# Patient Record
Sex: Male | Born: 1990 | Race: White | Hispanic: No | Marital: Married | Smoking: Current every day smoker
Health system: Southern US, Community
[De-identification: ages and names within clinical notes are randomized; demographics above are authoritative.]

## PROBLEM LIST (undated history)

## (undated) DIAGNOSIS — K319 Disease of stomach and duodenum, unspecified: Secondary | ICD-10-CM

## (undated) DIAGNOSIS — M419 Scoliosis, unspecified: Secondary | ICD-10-CM

## (undated) DIAGNOSIS — T7840XA Allergy, unspecified, initial encounter: Secondary | ICD-10-CM

## (undated) DIAGNOSIS — F419 Anxiety disorder, unspecified: Secondary | ICD-10-CM

## (undated) DIAGNOSIS — F32A Depression, unspecified: Secondary | ICD-10-CM

## (undated) DIAGNOSIS — F329 Major depressive disorder, single episode, unspecified: Secondary | ICD-10-CM

## (undated) DIAGNOSIS — K8020 Calculus of gallbladder without cholecystitis without obstruction: Secondary | ICD-10-CM

## (undated) DIAGNOSIS — K589 Irritable bowel syndrome without diarrhea: Secondary | ICD-10-CM

## (undated) HISTORY — PX: OTHER SURGICAL HISTORY: SHX169

## (undated) HISTORY — DX: Disease of stomach and duodenum, unspecified: K31.9

## (undated) HISTORY — DX: Calculus of gallbladder without cholecystitis without obstruction: K80.20

## (undated) HISTORY — DX: Allergy, unspecified, initial encounter: T78.40XA

---

## 2012-03-08 ENCOUNTER — Emergency Department (HOSPITAL_BASED_OUTPATIENT_CLINIC_OR_DEPARTMENT_OTHER)
Admission: EM | Admit: 2012-03-08 | Discharge: 2012-03-08 | Disposition: A | Discharge status: Home / Self Care | Payer: Self-pay | Attending: Emergency Medicine | Admitting: Emergency Medicine

## 2012-03-08 ENCOUNTER — Encounter (HOSPITAL_BASED_OUTPATIENT_CLINIC_OR_DEPARTMENT_OTHER): Payer: Self-pay

## 2012-03-08 DIAGNOSIS — R197 Diarrhea, unspecified: Secondary | ICD-10-CM | POA: Insufficient documentation

## 2012-03-08 DIAGNOSIS — K529 Noninfective gastroenteritis and colitis, unspecified: Secondary | ICD-10-CM

## 2012-03-08 DIAGNOSIS — R112 Nausea with vomiting, unspecified: Secondary | ICD-10-CM | POA: Insufficient documentation

## 2012-03-08 DIAGNOSIS — F17200 Nicotine dependence, unspecified, uncomplicated: Secondary | ICD-10-CM | POA: Insufficient documentation

## 2012-03-08 HISTORY — DX: Scoliosis, unspecified: M41.9

## 2012-03-08 MED ORDER — DIPHENOXYLATE-ATROPINE 2.5-0.025 MG PO TABS: 1.0000 | ORAL_TABLET | Freq: Four times a day (QID) | ORAL | Status: AC | PRN

## 2012-03-08 MED ORDER — ONDANSETRON 8 MG PO TBDP
8.0000 mg | ORAL_TABLET | Freq: Once | ORAL | Status: AC
  Administered 2012-03-08: 8 mg via ORAL
  Filled 2012-03-08: qty 1

## 2012-03-08 MED ORDER — ONDANSETRON 8 MG PO TBDP: 8.0000 mg | ORAL_TABLET | Freq: Three times a day (TID) | ORAL | Status: AC | PRN

## 2012-03-08 MED ORDER — DIPHENOXYLATE-ATROPINE 2.5-0.025 MG PO TABS
2.0000 | ORAL_TABLET | Freq: Once | ORAL | Status: AC
  Administered 2012-03-08: 2 via ORAL
  Filled 2012-03-08: qty 2

## 2012-03-08 NOTE — ED Notes (Signed)
Onset of vomiting about 2 day ago, diarrhea x 1 day.  Vomited 1x in 24 hours, 4-5 episodes of diarrhea in 24 hours.  Abdominal soreness/ cramping from vomiting per pt.

## 2012-03-08 NOTE — ED Provider Notes (Signed)
History     CSN: [id number]  Arrival date & time 03/08/12  2148   First MD Initiated Contact with Patient 03/08/12 2255      Chief Complaint  Patient presents with  . Nausea, vomiting and diarrhea     (Consider location/radiation/quality/duration/timing/severity/associated sxs/prior treatment) HPI Is a 21 year old white male with a two-day history of vomiting and diarrhea. The vomiting started abruptly yesterday without significant preceding nausea. He has had multiple episodes of diarrhea both yesterday and today. He has also had several episodes of vomiting today. There's been some abdominal cramping associated with vomiting but not with the diarrhea. He has noticed blood on toilet paper after wiping. He is not sure she's passed blood in his stool. He denies dry mouth or lightheadedness. He states he is fearful of needles and declined IV fluids offered by his nurse per protocol.  Past Medical History  Diagnosis Date  . Scoliosis     Past Surgical History  Procedure Date  . Multiple leg surgeries     d/t staph infection    History reviewed. No pertinent family history.  History  Substance Use Topics  . Smoking status: Current Everyday Smoker -- 0.5 packs/day for 1 years    Types: Cigarettes  . Smokeless tobacco: Never Used  . Alcohol Use: No      Review of Systems  All other systems reviewed and are negative.    Allergies  Cashew nut oil  Home Medications   Current Outpatient Rx  Name Route Sig Dispense Refill  . IBUPROFEN 200 MG PO TABS Oral Take 400 mg by mouth every 6 (six) hours as needed. For pain      BP 118/71  Pulse 81  Temp(Src) 98.2 F (36.8 C) (Oral)  Resp 15  Ht 5\' 7"  (1.702 m)  Wt 135 lb (61.236 kg)  BMI 21.14 kg/m2  SpO2 98%  Physical Exam General: Well-developed, well-nourished male in no acute distress; appearance consistent with age of record HENT: normocephalic, atraumatic; mucous membranes moist Eyes: pupils equal round and  reactive to light; extraocular muscles intact Neck: supple Heart: regular rate and rhythm; no murmurs, rubs or gallops Lungs: clear to auscultation bilaterally Abdomen: soft; nondistended; nontender; no masses or hepatosplenomegaly; bowel sounds present Rectal: Normal sphincter tone; brown stool in rectal vault; no blood on examining glove Extremities: No deformity; full range of motion Neurologic: Awake, alert and oriented; motor function intact in all extremities and symmetric; no facial droop Skin: Warm and dry Psychiatric: Anxious    ED Course  Procedures (including critical care time)     MDM  We'll treat with by mouth medications. Patient was advised to return for inability to keep hydrated or for increased bleeding per rectum.        Hanley Seamen, MD 03/08/12 [phone]

## 2012-06-24 ENCOUNTER — Encounter (HOSPITAL_COMMUNITY): Payer: Self-pay | Admitting: *Deleted

## 2012-06-24 ENCOUNTER — Emergency Department (HOSPITAL_COMMUNITY)
Admission: EM | Admit: 2012-06-24 | Discharge: 2012-06-25 | Disposition: A | Discharge status: Other Acute Inpatient Hospital | Payer: Self-pay | Attending: *Deleted | Admitting: *Deleted

## 2012-06-24 DIAGNOSIS — T50901A Poisoning by unspecified drugs, medicaments and biological substances, accidental (unintentional), initial encounter: Secondary | ICD-10-CM | POA: Insufficient documentation

## 2012-06-24 DIAGNOSIS — X838XXA Intentional self-harm by other specified means, initial encounter: Secondary | ICD-10-CM | POA: Insufficient documentation

## 2012-06-24 DIAGNOSIS — T1491XA Suicide attempt, initial encounter: Secondary | ICD-10-CM

## 2012-06-24 DIAGNOSIS — F329 Major depressive disorder, single episode, unspecified: Secondary | ICD-10-CM

## 2012-06-24 DIAGNOSIS — F3289 Other specified depressive episodes: Secondary | ICD-10-CM | POA: Insufficient documentation

## 2012-06-24 HISTORY — DX: Irritable bowel syndrome, unspecified: K58.9

## 2012-06-24 LAB — COMPREHENSIVE METABOLIC PANEL
ALT: 11 U/L (ref 0–53)
Alkaline Phosphatase: 62 U/L (ref 39–117)
BUN: 14 mg/dL (ref 6–23)
CO2: 25 mEq/L (ref 19–32)
Chloride: 102 mEq/L (ref 96–112)
GFR calc Af Amer: >90 mL/min (ref >90)
Glucose, Bld: 85 mg/dL (ref 70–99)
Potassium: 3.3 mEq/L — ABNORMAL LOW (ref 3.5–5.1)
Total Bilirubin: 0.3 mg/dL (ref 0.3–1.2)

## 2012-06-24 LAB — CBC WITH DIFFERENTIAL/PLATELET
Hemoglobin: 15.9 g/dL (ref 13.0–17.0)
Lymphocytes Relative: 30 % (ref 12–46)
Lymphs Abs: 2.1 10*3/uL (ref 0.7–4.0)
MCH: 32.9 pg (ref 26.0–34.0)
Monocytes Relative: 7 % (ref 3–12)
Neutro Abs: 4.2 10*3/uL (ref 1.7–7.7)
Neutrophils Relative %: 60 % (ref 43–77)
RBC: 4.84 MIL/uL (ref 4.22–5.81)
WBC: 7 10*3/uL (ref 4.0–10.5)

## 2012-06-24 NOTE — ED Notes (Signed)
Poison control recommends charcoal without sorbitol, watch for anticholinergic effects, IV fluids, cardiac monitoring, ASA level, four hour post ingestion tylenol, monitor x6 hours, if patient becomes agitated treat with benzodiazepines

## 2012-06-24 NOTE — ED Notes (Signed)
ZOX:WRUE<AV> Expected date:06/24/12<BR> Expected time:10:20 PM<BR> Means of arrival:Ambulance<BR> Comments:<BR> Overdose 20 otc sleeping pills

## 2012-06-24 NOTE — ED Notes (Signed)
Pt in via EMS, pt took approx 20 OTC sleeping pills, unsure of exact medication, pt ingested pills about 20-30 min ago, pt alert and oriented, drowsy, airway intact, pt admits to SI

## 2012-06-25 ENCOUNTER — Inpatient Hospital Stay (HOSPITAL_COMMUNITY)
Admission: AD | Admit: 2012-06-25 | Discharge: 2012-07-01 | DRG: 885 | Disposition: A | Discharge status: Home / Self Care | Payer: Federal, State, Local not specified - Other | Source: Ambulatory Visit | Attending: Psychiatry | Admitting: Psychiatry

## 2012-06-25 ENCOUNTER — Encounter (HOSPITAL_COMMUNITY): Payer: Self-pay | Admitting: Rehabilitation

## 2012-06-25 DIAGNOSIS — K6289 Other specified diseases of anus and rectum: Secondary | ICD-10-CM | POA: Diagnosis not present

## 2012-06-25 DIAGNOSIS — Z79899 Other long term (current) drug therapy: Secondary | ICD-10-CM

## 2012-06-25 DIAGNOSIS — M4120 Other idiopathic scoliosis, site unspecified: Secondary | ICD-10-CM | POA: Diagnosis present

## 2012-06-25 DIAGNOSIS — F410 Panic disorder [episodic paroxysmal anxiety] without agoraphobia: Secondary | ICD-10-CM

## 2012-06-25 DIAGNOSIS — F17200 Nicotine dependence, unspecified, uncomplicated: Secondary | ICD-10-CM | POA: Diagnosis present

## 2012-06-25 DIAGNOSIS — F332 Major depressive disorder, recurrent severe without psychotic features: Principal | ICD-10-CM | POA: Diagnosis present

## 2012-06-25 DIAGNOSIS — K589 Irritable bowel syndrome without diarrhea: Secondary | ICD-10-CM | POA: Diagnosis present

## 2012-06-25 DIAGNOSIS — F1210 Cannabis abuse, uncomplicated: Secondary | ICD-10-CM | POA: Diagnosis present

## 2012-06-25 DIAGNOSIS — IMO0002 Reserved for concepts with insufficient information to code with codable children: Secondary | ICD-10-CM

## 2012-06-25 DIAGNOSIS — F339 Major depressive disorder, recurrent, unspecified: Secondary | ICD-10-CM | POA: Diagnosis present

## 2012-06-25 DIAGNOSIS — F411 Generalized anxiety disorder: Secondary | ICD-10-CM | POA: Diagnosis present

## 2012-06-25 HISTORY — DX: Depression, unspecified: F32.A

## 2012-06-25 HISTORY — DX: Anxiety disorder, unspecified: F41.9

## 2012-06-25 HISTORY — DX: Major depressive disorder, single episode, unspecified: F32.9

## 2012-06-25 LAB — RAPID URINE DRUG SCREEN, HOSP PERFORMED
Amphetamines: NOT DETECTED
Barbiturates: NOT DETECTED
Cocaine: NOT DETECTED
Tetrahydrocannabinol: POSITIVE — AB

## 2012-06-25 LAB — SALICYLATE LEVEL: Salicylate Lvl: <2 mg/dL — ABNORMAL LOW (ref 2.8–20.0)

## 2012-06-25 MED ORDER — IBUPROFEN 600 MG PO TABS: 600.0000 mg | ORAL_TABLET | Freq: Three times a day (TID) | ORAL | Status: DC | PRN

## 2012-06-25 MED ORDER — POTASSIUM CHLORIDE CRYS ER 10 MEQ PO TBCR
10.0000 meq | EXTENDED_RELEASE_TABLET | ORAL | Status: AC
  Administered 2012-06-25 – 2012-06-28 (×6): 10 meq via ORAL
  Filled 2012-06-25 (×7): qty 1

## 2012-06-25 MED ORDER — CITALOPRAM HYDROBROMIDE 20 MG PO TABS
20.0000 mg | ORAL_TABLET | Freq: Every day | ORAL | Status: DC
  Administered 2012-06-25 – 2012-06-29 (×5): 20 mg via ORAL
  Filled 2012-06-25 (×7): qty 1

## 2012-06-25 MED ORDER — MAGNESIUM HYDROXIDE 400 MG/5ML PO SUSP
30.0000 mL | Freq: Every day | ORAL | Status: DC | PRN
  Administered 2012-06-29: 30 mL via ORAL

## 2012-06-25 MED ORDER — ONDANSETRON HCL 4 MG/2ML IJ SOLN
INTRAMUSCULAR | Status: AC
  Filled 2012-06-25: qty 2

## 2012-06-25 MED ORDER — ACETAMINOPHEN 325 MG PO TABS
650.0000 mg | ORAL_TABLET | Freq: Four times a day (QID) | ORAL | Status: DC | PRN
  Administered 2012-06-26: 650 mg via ORAL

## 2012-06-25 MED ORDER — ALUM & MAG HYDROXIDE-SIMETH 200-200-20 MG/5ML PO SUSP
30.0000 mL | ORAL | Status: DC | PRN
  Administered 2012-06-27: 30 mL via ORAL

## 2012-06-25 MED ORDER — ONDANSETRON HCL 4 MG PO TABS: 4.0000 mg | ORAL_TABLET | Freq: Three times a day (TID) | ORAL | Status: DC | PRN

## 2012-06-25 MED ORDER — NICOTINE 21 MG/24HR TD PT24
21.0000 mg | MEDICATED_PATCH | Freq: Every day | TRANSDERMAL | Status: DC
  Filled 2012-06-25: qty 1

## 2012-06-25 MED ORDER — TRAZODONE HCL 100 MG PO TABS
100.0000 mg | ORAL_TABLET | Freq: Every evening | ORAL | Status: DC | PRN
  Administered 2012-06-25 – 2012-06-30 (×5): 100 mg via ORAL
  Filled 2012-06-25: qty 1
  Filled 2012-06-25: qty 14
  Filled 2012-06-25 (×4): qty 1

## 2012-06-25 MED ORDER — ALUM & MAG HYDROXIDE-SIMETH 200-200-20 MG/5ML PO SUSP: 30.0000 mL | ORAL | Status: DC | PRN

## 2012-06-25 MED ORDER — POTASSIUM CHLORIDE CRYS ER 10 MEQ PO TBCR: 10.0000 meq | EXTENDED_RELEASE_TABLET | ORAL | Status: DC

## 2012-06-25 NOTE — ED Provider Notes (Addendum)
BP 106/66  Pulse 64  Temp 97.9 F (36.6 C) (Oral)  Resp 16  SpO2 98%  Patient seen and evaluated by me. No complaints at this time.   Forbes Cellar, MD 06/25/12 [phone]  Patient accepted to [facility] Dr. Elsie Saas per ACT team. Stable for transfer.   Forbes Cellar, MD 06/25/12 1012

## 2012-06-25 NOTE — Progress Notes (Signed)
[facility] Group Notes: (Counselor/Nursing/MHT/Case Management/Adjunct) 06/25/2012   @1 :15pm Breathing & Meditation for Anxiety/Anger   Type of Therapy:  Group Therapy  Participation Level:  Limited  Participation Quality: Attentive    Affect:  Appropriate  Cognitive:  Appropriate  Insight:  Limited  Engagement in Group: Good  Engagement in Therapy:  Limited  Modes of Intervention:  Support and Exploration  Summary of Progress/Problems: Manuel Dean participated in color breathing and safe place guided imagery excercises. He did not process his experience afterward, but did state that he felt more calm.  [name], LCSW 06/25/2012  2:43 PM

## 2012-06-25 NOTE — Progress Notes (Signed)
Psychoeducational Group Note  Date:  06/25/2012 Time:  2155  Group Topic/Focus:  Wrap-Up Group:   The focus of this group is to help patients review their daily goal of treatment and discuss progress on daily workbooks.  Participation Level:  Did Not Attend  Participation Quality:  Didnt attend group  Affect:  Flat  Cognitive:  Appropriate  Insight:  None  Engagement in Group:  None  Additional Comments:  Pt sleeping in bed during group.    Aundria Rud, Alphonza Tramell L 06/25/2012, 9:55 PM
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
## 2012-06-25 NOTE — ED Provider Notes (Signed)
History     CSN: [id number]  Arrival date & time 06/24/12  2239   First MD Initiated Contact with Patient 06/24/12 2329      Chief Complaint  Patient presents with  . Drug Overdose    (Consider location/radiation/quality/duration/timing/severity/associated sxs/prior treatment) HPI This is a 21 year old white male who took an unknown number of an unspecified over-the-counter sleeping pill as well as an unknown number of an unspecified prescription sleeping medication about 30 minutes prior to arrival. He did this in an attempt to commit suicide. He states he is homeless and does not believe life is worth living anymore. He speaks vaguely of other stressors in his life that are "only getting worse". He complains of severe aching in his legs which she attributes to the pills he took. He states he is sleepy but the pain in his legs is preventing him from sleeping. He he denies nausea or vomiting. He denies chest pain, dyspnea or abdominal pain.  Past Medical History  Diagnosis Date  . Scoliosis   . IBS (irritable bowel syndrome)     Past Surgical History  Procedure Date  . Multiple leg surgeries     d/t staph infection    History reviewed. No pertinent family history.  History  Substance Use Topics  . Smoking status: Current Everyday Smoker -- 0.5 packs/day for 1 years    Types: Cigarettes  . Smokeless tobacco: Never Used  . Alcohol Use: No      Review of Systems  All other systems reviewed and are negative.    Allergies  Cashew nut oil and Sulfa antibiotics  Home Medications   Current Outpatient Rx  Name Route Sig Dispense Refill  . IBUPROFEN 200 MG PO TABS Oral Take 400 mg by mouth every 6 (six) hours as needed. For pain    . OVER THE COUNTER MEDICATION Oral Take 1 tablet by mouth at bedtime as needed. Sleeping medication      BP 102/52  Pulse 72  Temp 98.3 F (36.8 C) (Oral)  Resp 16  SpO2 100%  Physical Exam General: Well-developed, well-nourished  male in no acute distress; appearance consistent with age of record HENT: normocephalic, atraumatic Eyes: pupils equal round and reactive to light; extraocular muscles intact Neck: supple Heart: regular rate and rhythm Lungs: clear to auscultation bilaterally Abdomen: soft; nondistended; nontender; bowel sounds present Extremities: No deformity; full range of motion; pulses normal Neurologic: Awake but drowsy; motor function intact in all extremities and symmetric; dysarthria Skin: Warm and dry Psychiatric: Suicidal ideation; depression    ED Course  Procedures (including critical care time)     MDM   Nursing notes and vitals signs, including pulse oximetry, reviewed.  Summary of this visit's results, reviewed by myself:  Labs:  Results for orders placed during the hospital encounter of 06/24/12  CBC WITH DIFFERENTIAL      Component Value Range   WBC 7.0  4.0 - 10.5 K/uL   RBC 4.84  4.22 - 5.81 MIL/uL   Hemoglobin 15.9  13.0 - 17.0 g/dL   HCT 52.8  41.3 - 24.4 %   MCV 90.1  78.0 - 100.0 fL   MCH 32.9  26.0 - 34.0 pg   MCHC 36.5 (*) 30.0 - 36.0 g/dL   RDW 01.0  27.2 - 53.6 %   Platelets 205  150 - 400 K/uL   Neutrophils Relative 60  43 - 77 %   Neutro Abs 4.2  1.7 - 7.7 K/uL   Lymphocytes  Relative 30  12 - 46 %   Lymphs Abs 2.1  0.7 - 4.0 K/uL   Monocytes Relative 7  3 - 12 %   Monocytes Absolute 0.5  0.1 - 1.0 K/uL   Eosinophils Relative 3  0 - 5 %   Eosinophils Absolute 0.2  0.0 - 0.7 K/uL   Basophils Relative 1  0 - 1 %   Basophils Absolute 0.1  0.0 - 0.1 K/uL  COMPREHENSIVE METABOLIC PANEL      Component Value Range   Sodium 138  135 - 145 mEq/L   Potassium 3.3 (*) 3.5 - 5.1 mEq/L   Chloride 102  96 - 112 mEq/L   CO2 25  19 - 32 mEq/L   Glucose, Bld 85  70 - 99 mg/dL   BUN 14  6 - 23 mg/dL   Creatinine, Ser 1.61  0.50 - 1.35 mg/dL   Calcium 9.1  8.4 - 09.6 mg/dL   Total Protein 6.6  6.0 - 8.3 g/dL   Albumin 4.3  3.5 - 5.2 g/dL   AST 22  0 - 37 U/L    ALT 11  0 - 53 U/L   Alkaline Phosphatase 62  39 - 117 U/L   Total Bilirubin 0.3  0.3 - 1.2 mg/dL   GFR calc non Af Amer 88 (*) >90 mL/min   GFR calc Af Amer >90  >90 mL/min  SALICYLATE LEVEL      Component Value Range   Salicylate Lvl <2.0 (*) 2.8 - 20.0 mg/dL  ETHANOL      Component Value Range   Alcohol, Ethyl (B) <11  0 - 11 mg/dL  ACETAMINOPHEN LEVEL      Component Value Range   Acetaminophen (Tylenol), Serum <15.0  10 - 30 ug/mL  URINE RAPID DRUG SCREEN (HOSP PERFORMED)      Component Value Range   Opiates NONE DETECTED  NONE DETECTED   Cocaine NONE DETECTED  NONE DETECTED   Benzodiazepines NONE DETECTED  NONE DETECTED   Amphetamines NONE DETECTED  NONE DETECTED   Tetrahydrocannabinol POSITIVE (*) NONE DETECTED   Barbiturates NONE DETECTED  NONE DETECTED      EKG Interpretation:  Date & Time: 06/24/2012 10:50 PM  Rate: 82  Rhythm: normal sinus rhythm  QRS Axis: right  Intervals: normal  ST/T Wave abnormalities: normal  Conduction Disutrbances:none  Narrative Interpretation: LVH  Old EKG Reviewed: none available  5:32 AM Patient has been evaluated by ACT who will attempt to place patient in an inpatient psychiatric facility.        Hanley Seamen, MD 06/25/12 [phone]

## 2012-06-25 NOTE — ED Notes (Signed)
ACT at bedside 

## 2012-06-26 DIAGNOSIS — F1210 Cannabis abuse, uncomplicated: Secondary | ICD-10-CM | POA: Diagnosis present

## 2012-06-26 DIAGNOSIS — F410 Panic disorder [episodic paroxysmal anxiety] without agoraphobia: Secondary | ICD-10-CM | POA: Diagnosis present

## 2012-06-26 DIAGNOSIS — F339 Major depressive disorder, recurrent, unspecified: Secondary | ICD-10-CM

## 2012-06-26 DIAGNOSIS — F411 Generalized anxiety disorder: Secondary | ICD-10-CM

## 2012-06-26 MED ORDER — ADULT MULTIVITAMIN LIQUID CH
5.0000 mL | Freq: Every day | ORAL | Status: DC
  Administered 2012-06-26: 5 mL via ORAL
  Filled 2012-06-26 (×4): qty 5

## 2012-06-26 MED ORDER — GABAPENTIN 100 MG PO CAPS
100.0000 mg | ORAL_CAPSULE | Freq: Three times a day (TID) | ORAL | Status: DC
  Administered 2012-06-26 (×2): 100 mg via ORAL
  Filled 2012-06-26 (×3): qty 1

## 2012-06-26 MED ORDER — NICOTINE POLACRILEX 2 MG MT GUM: 2.0000 mg | CHEWING_GUM | OROMUCOSAL | Status: DC | PRN

## 2012-06-26 MED ORDER — NICOTINE 21 MG/24HR TD PT24
21.0000 mg | MEDICATED_PATCH | Freq: Every day | TRANSDERMAL | Status: DC
  Administered 2012-06-27 – 2012-07-01 (×5): 21 mg via TRANSDERMAL
  Filled 2012-06-26 (×8): qty 1

## 2012-06-26 MED ORDER — CLONAZEPAM 0.5 MG PO TABS
0.5000 mg | ORAL_TABLET | ORAL | Status: DC
  Administered 2012-06-26 – 2012-06-29 (×6): 0.5 mg via ORAL
  Filled 2012-06-26 (×6): qty 1
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
## 2012-06-26 NOTE — Progress Notes (Signed)
Psychoeducational Group Note  Date:  06/26/2012 Time:  1000  Group Topic/Focus:  Relapse Prevention Planning:   The focus of this group is to define relapse and discuss the need for planning to combat relapse.  Participation Level:  None  Participation Quality:  Appropriate and Drowsy  Affect:  Flat  Cognitive:  Appropriate  Insight:  Pt did not participate.  Engagement in Group:  None  Additional Comments:  Pt was present for group but did not participate.   Dalia Heading 06/26/2012, 11:38 AM
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
## 2012-06-26 NOTE — Progress Notes (Signed)
[facility] Group Notes:  (Counselor/Nursing/MHT/Case Management/Adjunct)  06/26/2012 11:12 PM  Type of Therapy:  Psychoeducational Skills  Participation Level:  Minimal  Participation Quality:  Appropriate  Affect:  Appropriate  Cognitive:  Appropriate  Insight:  Good  Engagement in Group:  Good  Engagement in Therapy:  Good  Modes of Intervention:  Education  Summary of Progress/Problems: Pt. States that he had a good day.Pt. verbalizes that his goal for tomorrow is to experience less anxiety and to see if his new medication has been effective.    Westly Pam 06/26/2012, 11:12 PMThe focus of this group is to help patients establish daily goals to achieve during treatment and discuss how the patient can incorporate goal setting into their daily lives to aide in recovery.

## 2012-06-26 NOTE — Progress Notes (Signed)
Brief Nutrition Note  Patient identified on the Nutrition Risk Report for weight loss.  Body mass index is 20.63 kg/(m^2). WNL Ht:  5'5", Wt:  124# UBW:  143# 87%IBW  Wt Readings from Last 10 Encounters:  06/25/12 124 lb (56.246 kg)  03/08/12 135 lb (61.236 kg)   CMP     Component Value Date/Time   NA 138 06/24/2012 2310   K 3.3* 06/24/2012 2310   CL 102 06/24/2012 2310   CO2 25 06/24/2012 2310   GLUCOSE 85 06/24/2012 2310   BUN 14 06/24/2012 2310   CREATININE 1.18 06/24/2012 2310   CALCIUM 9.1 06/24/2012 2310   PROT 6.6 06/24/2012 2310   ALBUMIN 4.3 06/24/2012 2310   AST 22 06/24/2012 2310   ALT 11 06/24/2012 2310   ALKPHOS 62 06/24/2012 2310   BILITOT 0.3 06/24/2012 2310   GFRNONAA 88* 06/24/2012 2310   GFRAA >90 06/24/2012 2310      . citalopram  20 mg Oral Daily  . gabapentin  100 mg Oral TID  . nicotine  21 mg Transdermal Q0600  . potassium chloride  10 mEq Oral BH-qamhs  . DISCONTD: potassium chloride  10 mEq Oral BH-qamhs   Diet:  Regular-fair intake Diet Hx:  Does not tolerate lactose or greasy foods.  Was limiting Gluten.  Pt dx with IBS and reports blood in his stools often.  Homeless.  Does not tolerate Ensure or Ensure Clear liquid  Discussed proper nutrition for IBS and known limitations secondary to homeless.  Pt able to verbalize and very polite.   Continue diet as tolerated.  Will Add MVI.  Oran Rein, RD [phone]

## 2012-06-26 NOTE — Progress Notes (Signed)
[facility] Group Notes: (Counselor/Nursing/MHT/Case Management/Adjunct) 06/26/2012   @11 :00am Preventing Relapse  Type of Therapy:  Group Therapy  Participation Level:  Did Not Attend    Billie Lade 06/26/2012 1:03 PM
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
## 2012-06-26 NOTE — Progress Notes (Signed)
D: Pt in bed resting with eyes closed. Respirations even and unlabored. Pt appears to be in no signs of distress at this time. A: Q15min checks remains for this pt. R: Pt remains safe at this time.   

## 2012-06-26 NOTE — Progress Notes (Signed)
Pt attended discharge planning group and actively participated.  Pt presents with flat affect and depressed mood.  Pt rates depression at a 7 and anxiety at a 9 today.  Pt denies SI.  Pt was open with sharing reason for entering the hospital.  Pt states that he has been depressed and anxious as long as he can remember.  Pt states that he tried to overdose on sleeping pills.  Pt states that he doesn't have a psychiatrist or therapist.  SW will refer pt to [facility] for medication management and therapy.  Pt states that he is homeless, living on the street or staying with a friend.  No further needs voiced by pt at this time.    Reyes Ivan, LCSWA 06/26/2012  9:58 AM

## 2012-06-26 NOTE — Progress Notes (Signed)
D-Patient reports feeling depressed today. Reports fair sleep. He reports crampy abdominal pain from IBS symptoms. Able to eat breakfest and reports that appetite is improving. Rates depression at 6 and feeling hopeless at 7. Reports passive SI last evening but denies today. Optimistic about his new medication for depression which is celexa.  A-Talked with patient about how his IBS came to affect multiple areas of his life. Reports being diagnosed one year ago. Received morning dose of celexa.  R-Patient is up on the unit and attending groups. Anxious to meet with MD this morning. Monitored every fifteen minutes for safety. Able to verbalize reason for medications and possible side effects. Open to talking with staff about reasons for depression and ways to improve his situation.

## 2012-06-26 NOTE — Progress Notes (Signed)
[facility] Adult Inpatient Family/Significant Other Suicide Prevention Education  Suicide Prevention Education:  Patient Refusal for Family/Significant Other Suicide Prevention Education: The patient Manuel Dean has refused to provide written consent for family/significant other to be provided Family/Significant Other Suicide Prevention Education during admission and/or prior to discharge.  Physician notified.  Counselor provided [employer] with suicide prevention pamphlet and reviewed the information contained therein. Anthem verbalized understanding of suicide risk factors, warning signs, and crisis contacts.   Billie Lade 06/26/2012, 3:23 PM

## 2012-06-27 LAB — T4, FREE: Free T4: 0.79 ng/dL — ABNORMAL LOW (ref 0.80–1.80)

## 2012-06-27 LAB — T3, FREE: T3, Free: 2.6 pg/mL (ref 2.3–4.2)

## 2012-06-27 MED ORDER — ADULT MULTIVITAMIN W/MINERALS CH
1.0000 | ORAL_TABLET | Freq: Every day | ORAL | Status: DC
  Administered 2012-06-27 – 2012-07-01 (×5): 1 via ORAL
  Filled 2012-06-27 (×7): qty 1

## 2012-06-27 NOTE — Progress Notes (Signed)
Patient ID: Manuel Dean, male   DOB: 1991/05/19, 21 y.o.   MRN: [id number] D. The patient has a flat affect and depressed mood. Isolated in the corner of the dayroom attempting to do Sudoko puzzles. Stated he was trying to distract himself from thinking about his hopeless situation, but he was having a hard time trying to concentrate. Talked about feeling hopeless and helpless. Has no support system in place. Parents do not approve of him taking medicine or being in the hospital for mental health issues due to their religious beliefs. He is homeless and is not sure where he will live upon discharge. He does not feel he is able to work at this time due to his I.B.S. and realizes that if he doesn't work he will not be able to afford a place to live, food or medication. A. Support given. Encouraged to attend all groups and to work with the case manager and the team to help him make a suitable discharge plan.  R. The patient is receptive to working on discharge plan but does not want a family session with his parents, as he feels this will not go well. Attended and actively participated in evening wrap up group. The patient can contract for safety.

## 2012-06-27 NOTE — Progress Notes (Signed)
Manuel Dean is seen OOB UAL on the 500 hall this morning...tolerated fair. HE is timid and shy...frequently looking down and away from whomever he is speaking with . HE speaks very softly and in a quiet, hushed voice. HE is sad and depressed and is obviously uncomfortable. He completed his morning self assessment and on it he wrote he has not had any SI in the past 24 hrs, he rated his feelings of depression and hopelessness " 3 / 5 " respectively and stated his DC plan included " controlling stress". A HE is encouraged to attend his groups while he is here. R Safety is maintianed and POC cont with therapeutic relationship already established . PD RN [facility]

## 2012-06-27 NOTE — Progress Notes (Signed)
[facility] Group Notes:  (Counselor/Nursing/MHT/Case Management/Adjunct)  06/27/2012 11:42 AM  Type of Therapy:  After care Planning  Group  The pt. particiapted in after care planning group.  Pt. was given [place] SI pamphlet along with crisis and hotline numbers. Pt.'s in the group agreed to use them if needed.   Pt. sttaed he was doing good and came to [facility] two days ago. Pt. Slept well  And denies SI or HI.  Lamar Blinks [place] 06/27/2012, 11:42 AM

## 2012-06-27 NOTE — Progress Notes (Signed)
Patient ID: Manuel Dean, male   DOB: 1991-04-12, 21 y.o.   MRN: [id number] Pt. Spoke to therapist about his conflict with contacting his mother. Pt. Does not want his mother to know where he is but wants to let her know he is okay. Pt. States a nurse told him about a family meeting that can be arranged, but pt. Was sceptical due to parents past behavior and heir religious beliefs that make them feel the pt. Does not need to be on medication. Pt. Stated he would think about this and was encouraged by the therapist to think about calling mother to let her know he was okay. Pt. Refused consent previously but was told he could fill out a consent for if he wanted the counselor to talk with his parents.

## 2012-06-27 NOTE — Progress Notes (Signed)
Spoke with pt 1:1 at start of shift. Pt complaining of anxiety and is pacing in hallway. States visit with brother was extremely upsetting. "He told me I'm not welcome to come around my family. They think I just need Jesus." Pt states brother is generally supportive but is siding with parents at this time. Reports continued abdominal pain from IBS as well as rectal pain. States tylenol does not relieve pain and he cannot tolerate ibuprofen. States THC relieves the cramping and stimulates his appetite which is poor today. Plans to speak with provider tomorrow regarding issues. Also feels klonopin not "getting me through the day." Support given along with med education. He is pleasant and appropriate. Attended evening group. Does deny SI/HI/AVH at this time. Lawrence [place]se[place]

## 2012-06-27 NOTE — Progress Notes (Signed)
[facility] Group Notes:  (Counselor/Nursing/MHT/Case Management/Adjunct)  06/27/2012 3:56 PM  Type of Therapy:  Group Therapy  Participation Level:  Active  Participation Quality:  Appropriate and Attentive  Affect:  Appropriate  Cognitive:  Appropriate  Insight:  Good  Engagement in Group:  Good  Engagement in Therapy:  Good  Modes of Intervention:  Clarification, Education, Socialization and Support  Summary of Progress/Problems: Pt. participated in group discussion on self sabotaging behaviors and enabling. Each pt .was asked what self sabotaging means to them and each person gave an example of a self sabotaging behavior that they do and how they could enable themselves in a positive  Way. The pt. Spoke about his self sabotaging behaviors and discussed issues such as escaping from issues and going through loop holes. Pt. Stated that he could positively enable himself by accepting responsibility for  His actions.  Manuel Dean [place] 06/27/2012, 3:56 PM

## 2012-06-27 NOTE — Progress Notes (Signed)
[facility] MD Progress Note  06/27/2012 4:26 PM  S: "I had the best sleep that I have ever had last night. I wake up this morning feeling good and refreshed. My mood was good and everything. But my brother came to visit me today. I have always looked forward to his support for me. But today, it was the opposite. He said that what I was claiming to be wrong with me is a lie. He believed that religion is the answer to my problems. I feel more depressed and edgy right now. I thought that he would be more supportive of me But I believe my medicine is working well for me. I am not suicidal".  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 Axis II: Deferred Axis III:  Past Medical History  Diagnosis Date  . Scoliosis   . IBS (irritable bowel syndrome)   . Depression   . Anxiety    Axis IV: other psychosocial or environmental problems Axis V: 51-60 moderate symptoms  ADL's:  Intact  Sleep: Good  Appetite:  Good  Suicidal Ideation:  Plan:  No Intent:  No Means:  no Homicidal Ideation:  Plan:  No Intent:  No Means:  No  AEB (as evidenced by): per patient's reports.  Mental Status Examination/Evaluation: Objective:  Appearance: Casual  Eye Contact::  Good  Speech:  Clear and Coherent  Volume:  Normal  Mood:  Depressed  Affect:  Flat  Thought Process:  Coherent  Orientation:  Full  Thought Content:  Rumination  Suicidal Thoughts:  No  Homicidal Thoughts:  No  Memory:  Immediate;   Good Recent;   Good Remote;   Good  Judgement:  Fair  Insight:  Fair  Psychomotor Activity:  Normal  Concentration:  Fair  Recall:  Good  Akathisia:  No  Handed:  Right  AIMS (if indicated):     Assets:  Desire for Improvement  Sleep:  Number of Hours: 6    Vital Signs:Blood pressure 111/75, pulse 81, temperature 97.8 F (36.6 C), temperature source Oral, resp. rate 16, height 5'  5" (1.651 m), weight 56.246 kg (124 lb). Current Medications: Current Facility-Administered Medications  Medication Dose Route Frequency Provider Last Rate Last Dose  . acetaminophen (TYLENOL) tablet 650 mg  650 mg Oral Q6H PRN Curlene Labrum Readling, MD   650 mg at 06/26/12 1719  . alum & mag hydroxide-simeth (MAALOX/MYLANTA) 200-200-20 MG/5ML suspension 30 mL  30 mL Oral Q4H PRN Curlene Labrum Readling, MD   30 mL at 06/27/12 1313  . citalopram (CELEXA) tablet 20 mg  20 mg Oral Daily Curlene Labrum Readling, MD   20 mg at 06/27/12 0814  . clonazePAM (KLONOPIN) tablet 0.5 mg  0.5 mg Oral BH-qamhs Curlene Labrum Readling, MD   0.5 mg at 06/27/12 0814  . magnesium hydroxide (MILK OF MAGNESIA) suspension 30 mL  30 mL Oral Daily PRN Curlene Labrum Readling, MD      . multivitamin with minerals tablet 1 tablet  1 tablet Oral Daily Curlene Labrum Readling, MD   1 tablet at 06/27/12 0814  . nicotine (NICODERM CQ - dosed in mg/24 hours) patch 21 mg  21 mg Transdermal Q0600 Curlene Labrum Readling, MD   21 mg at 06/27/12 0654  . potassium chloride (K-DUR,KLOR-CON) CR tablet 10 mEq  10 mEq Oral BH-qamhs Curlene Labrum Readling, MD   10 mEq at 06/27/12 0815  . traZODone (DESYREL)  tablet 100 mg  100 mg Oral QHS PRN Curlene Labrum Readling, MD   100 mg at 06/26/12 2139  . DISCONTD: gabapentin (NEURONTIN) capsule 100 mg  100 mg Oral TID Sanjuana Kava, NP   100 mg at 06/26/12 1649  . DISCONTD: multivitamin liquid 5 mL  5 mL Oral Daily Curlene Labrum Readling, MD   5 mL at 06/26/12 1720    Lab Results:  Results for orders placed during the hospital encounter of 06/25/12 (from the past 48 hour(s))  TSH     Status: Normal   Collection Time   06/27/12  6:57 AM      Component Value Range Comment   TSH 1.329  0.350 - 4.500 uIU/mL   T3, FREE     Status: Normal   Collection Time   06/27/12  6:57 AM      Component Value Range Comment   T3, Free 2.6  2.3 - 4.2 pg/mL   T4, FREE     Status: Abnormal   Collection Time   06/27/12  6:57 AM      Component Value Range Comment   Free  T4 0.79 (*) 0.80 - 1.80 ng/dL     Physical Findings: AIMS:  , ,  ,  ,    CIWA:  CIWA-Ar Total: 4  COWS:     Treatment Plan Summary: Daily contact with patient to assess and evaluate symptoms and progress in treatment Medication management  Plan: Continue current treatment plan.  Armandina Stammer I 06/27/2012, 4:26 PM

## 2012-06-28 DIAGNOSIS — F1210 Cannabis abuse, uncomplicated: Secondary | ICD-10-CM

## 2012-06-28 DIAGNOSIS — F332 Major depressive disorder, recurrent severe without psychotic features: Principal | ICD-10-CM

## 2012-06-28 MED ORDER — HYDROXYZINE HCL 25 MG PO TABS
25.0000 mg | ORAL_TABLET | Freq: Four times a day (QID) | ORAL | Status: DC | PRN
  Filled 2012-06-28: qty 28

## 2012-06-28 MED ORDER — PRAMOXINE HCL 1 % RE FOAM
Freq: Three times a day (TID) | RECTAL | Status: DC | PRN
  Filled 2012-06-28: qty 15

## 2012-06-28 MED ORDER — HYOSCYAMINE SULFATE ER 0.375 MG PO TB12
0.3750 mg | ORAL_TABLET | Freq: Two times a day (BID) | ORAL | Status: DC
  Administered 2012-06-28 – 2012-07-01 (×6): 0.375 mg via ORAL
  Filled 2012-06-28 (×3): qty 1
  Filled 2012-06-28: qty 14
  Filled 2012-06-28 (×4): qty 1
  Filled 2012-06-28: qty 14
  Filled 2012-06-28 (×3): qty 1

## 2012-06-28 NOTE — Progress Notes (Signed)
Patient ID: Manuel Dean, male   DOB: 07-02-1991, 21 y.o.   MRN: [id number]  [facility] Group Notes:  (Counselor/Nursing/MHT/Case Management/Adjunct)  06/28/2012 1:15 PM  Type of Therapy:  Group Therapy, Dance/Movement Therapy   Participation Level:  Minimal  Participation Quality:  Appropriate  Affect:  Depressed  Cognitive:  Appropriate  Insight:  Limited  Engagement in Group:  Limited  Engagement in Therapy:  Good  Modes of Intervention:  Clarification, Problem-solving, Role-play, Socialization and Support  Summary of Progress/Problems:Therapist discussed the meaning of supports and why it is important to have supporters in our lives.  Therapist asked group, what does the term support mean to you.  Therapist discussed recognizing the difference between healthy and negative supports.  Therapist passed out a handout "What Is Wanted From Supporters" and group discussed the different types of supporters in their lives and what they mean to them.  Pt. stated, " Support means being there for you when you need it.  The help comes from my friends and family".       Rhunette Croft
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
## 2012-06-28 NOTE — Progress Notes (Signed)
Manuel Dean is OOB UAL on the 500 hall tolerated fair. He remains highly anxious.Marland KitchenMarland KitchenAttends his groups. Takes his meds as ordered. States he " still feels" as bad as he did when he first entered the hospitasl... A He is medicated per MD orders. R Safety is maintained and POC includes fostering therapeutic relationship already established PD RN [facility]

## 2012-06-29 MED ORDER — CYPROHEPTADINE HCL 4 MG PO TABS
4.0000 mg | ORAL_TABLET | Freq: Every day | ORAL | Status: DC
  Filled 2012-06-29 (×4): qty 1

## 2012-06-29 MED ORDER — NAPROXEN 500 MG PO TABS
500.0000 mg | ORAL_TABLET | Freq: Two times a day (BID) | ORAL | Status: DC
  Administered 2012-06-29 – 2012-07-01 (×4): 500 mg via ORAL
  Filled 2012-06-29 (×10): qty 1

## 2012-06-29 MED ORDER — CITALOPRAM HYDROBROMIDE 40 MG PO TABS
40.0000 mg | ORAL_TABLET | Freq: Every day | ORAL | Status: DC
  Administered 2012-06-30 – 2012-07-01 (×2): 40 mg via ORAL
  Filled 2012-06-29: qty 1
  Filled 2012-06-29: qty 14
  Filled 2012-06-29 (×2): qty 1

## 2012-06-29 MED ORDER — CLONAZEPAM 1 MG PO TABS
1.0000 mg | ORAL_TABLET | ORAL | Status: DC
  Administered 2012-06-29 – 2012-07-01 (×6): 1 mg via ORAL
  Filled 2012-06-29 (×7): qty 1

## 2012-06-29 NOTE — Progress Notes (Addendum)
Date: 06/29/2012        Time: 1145      Group Topic/Focus: Patient invited to participate in animal assisted therapy. Pets as a coping skill and responsibility were discussed.  Participation Level: Active  Participation Quality: Appropriate and Attentive  Affect: Appropriate  Cognitive: Appropriate and Oriented   Additional Comments: None   Maveric Debono 06/29/2012 12:34 PM  

## 2012-06-29 NOTE — Progress Notes (Signed)
Currently resting quietly in bed in left lateral position with eyes closed. Respirations are even and unlabored. No acute distress noted. Safety has been maintained with Q15 minute observation. Will continue current POC.

## 2012-06-29 NOTE — Progress Notes (Addendum)
Interdisciplinary Treatment Plan Update (Adult)  Date:  06/29/2012  Time Reviewed:  11:02 AM   Progress in Treatment: Attending groups: Yes Participating in groups:  Yes Taking medication as prescribed: Yes Tolerating medication:  Yes Family/Significant other contact made: Counselor assessing for appropriate contact  Patient understands diagnosis:  Yes Discussing patient identified problems/goals with staff:  Yes Medical problems stabilized or resolved:  Yes Denies suicidal/homicidal ideation: Yes Issues/concerns per patient self-inventory:  None identified Other: N/A  New problem(s) identified: None Identified  Reason for Continuation of Hospitalization: Anxiety Depression Medication stabilization  Interventions implemented related to continuation of hospitalization: mood stabilization, medication monitoring and adjustment, group therapy and psycho education, safety checks q 15 mins  Additional comments: N/A  Estimated length of stay: 3-5 days  Discharge Plan: SW is assessing for appropriate follow up referrals.   New goal(s): N/A  Review of initial/current patient goals per problem list:    1.  Goal(s): Reduce depressive symptoms  Met:  No  Target date: by discharge  As evidenced by: Reducing depression from a 10 to a 3 as reported by pt.   2.  Goal (s): Reduce/Eliminate suicidal ideation  Met:  No  Target date: by discharge  As evidenced by: pt reporting no SI.    3.  Goal(s): Reduce anxiety symptoms  Met:  No  Target date: by discharge  As evidenced by: Reduce anxiety from a 10 to a 3 as reported by pt.    Attendees:  Patient: Manuel Dean 06/29/2012 10:30 am  Family:    Physician: Franchot Gallo, MD  06/29/2012 10:21 AM   Nursing: Barrie Folk, RN  06/29/2012 10:22 AM   Case Manager: Reyes Ivan, LCSWA  06/29/2012 10:21 AM   Counselor: [name], LCSW  06/29/2012 10:21 AM   Other: Juline Patch, LCSW  06/29/2012 10:21 AM   Other: Serena Colonel, NP  06/29/2012 10:21 AM   Other: Verne Spurr, PA  06/29/2012 10:22 AM   Other: Shelda Jakes, RN  06/29/2012 10:22 AM     Scribe for Treatment Team:   Carmina Miller, 06/29/2012 , 11:02 AM

## 2012-06-29 NOTE — Progress Notes (Signed)
[facility] MD Progress Note  06/29/2012 4:02 PM  Diagnosis:  Axis I: Major Depressive Disorder - Recurrent.  Generalized Anxiety Disorder.  Panic Disorder without Agoraphobia.   The patient was seen today and reports the following:   ADL's: Intact.  Sleep: The patient reports to having some ongoing difficulty initiating and maintaining sleep.  Appetite: The patient reports a decreased appetite today.   Mild>(1-10) >Severe  Hopelessness (1-10): 10  Depression (1-10): 7-8  Anxiety (1-10): 10   Suicidal Ideation:  The patient denies any current suicidal ideations today.  Plan: No  Intent: No  Means: No   Homicidal Ideation: The patient denies any homicidal ideations today.  Plan: No  Intent: No.  Means: No   General Appearance/Behavior: The patient remains cooperative today with this provider.  Eye Contact: Good.  Speech: Appropriate in rate and volume with no pressuring noted today.  Motor Behavior: wnl  Level of Consciousness: Alert and Oriented x 3.  Mental Status: Alert and Oriented x 3.  Mood: Moderate to severely depressed today.  Affect: Moderately constricted.  Anxiety Level: Severe anxiety reported today.  Thought Process: wnl.  Thought Content: The patient denies any auditory or visual hallucinations today as well as any delusions thinking.  Perception: wnl.  Judgment: Fair to good.  Insight: Fair to good.  Cognition: Oriented to person, place and time.  Sleep:  Number of Hours: 6.5    Vital Signs:Blood pressure 126/90, pulse 86, temperature 97.9 F (36.6 C), temperature source Oral, resp. rate 18, height 5\' 5"  (1.651 m), weight 56.246 kg (124 lb).  Current Medications: Current Facility-Administered Medications  Medication Dose Route Frequency Provider Last Rate Last Dose  . acetaminophen (TYLENOL) tablet 650 mg  650 mg Oral Q6H PRN Curlene Labrum Deshan Hemmelgarn, MD   650 mg at 06/26/12 1719  . alum & mag hydroxide-simeth (MAALOX/MYLANTA) 200-200-20 MG/5ML suspension 30 mL  30  mL Oral Q4H PRN Curlene Labrum Gagan Dillion, MD   30 mL at 06/27/12 1313  . citalopram (CELEXA) tablet 40 mg  40 mg Oral Daily Curlene Labrum Misha Vanoverbeke, MD      . clonazePAM (KLONOPIN) tablet 1 mg  1 mg Oral [id number] Curlene Labrum Ciera Beckum, MD   1 mg at 06/29/12 1502  . cyproheptadine (PERIACTIN) 4 MG tablet 4 mg  4 mg Oral QHS Curlene Labrum Rucker Pridgeon, MD      . hydrOXYzine (ATARAX/VISTARIL) tablet 25 mg  25 mg Oral QID PRN Verne Spurr, PA-C      . hyoscyamine (LEVBID) 0.375 MG 12 hr tablet 0.375 mg  0.375 mg Oral Q12H Verne Spurr, PA-C   0.375 mg at 06/29/12 1610  . magnesium hydroxide (MILK OF MAGNESIA) suspension 30 mL  30 mL Oral Daily PRN Curlene Labrum Ceri Mayer, MD   30 mL at 06/29/12 0930  . multivitamin with minerals tablet 1 tablet  1 tablet Oral Daily Curlene Labrum Axelle Szwed, MD   1 tablet at 06/29/12 9604  . naproxen (NAPROSYN) tablet 500 mg  500 mg Oral BID WC Curlene Labrum Synthia Fairbank, MD      . nicotine (NICODERM CQ - dosed in mg/24 hours) patch 21 mg  21 mg Transdermal Q0600 Curlene Labrum Birttany Dechellis, MD   21 mg at 06/29/12 0923  . pramoxine (PROCTOFOAM) 1 % foam   Rectal TID PRN Verne Spurr, PA-C      . traZODone (DESYREL) tablet 100 mg  100 mg Oral QHS PRN Curlene Labrum Kemora Pinard, MD   100 mg at 06/28/12 2128  . DISCONTD: citalopram (CELEXA)  tablet 20 mg  20 mg Oral Daily Curlene Labrum Jermaine Tholl, MD   20 mg at 06/29/12 0923  . DISCONTD: clonazePAM (KLONOPIN) tablet 0.5 mg  0.5 mg Oral BH-qamhs Curlene Labrum Rossy Virag, MD   0.5 mg at 06/29/12 7829   Lab Results: No results found for this or any previous visit (from the past 48 hour(s)).  Review of Systems:  Neurological: The patient denies any headaches today. He denies any seizures or dizziness.  G.I.: The patient denies any constipation or G.I. Upset today. He does reports a history of IBS.  Musculoskeletal: The patient reports chronic back pain today.   Time was spent today discussing with the patient his current symptoms. The patient reports to having ongoing difficulty initiating and maintaining  sleep. He reports his appetite is decreased with moderate to severe feelings of sadness, anhedonia and depressed mood.  He denies any current suicidal or homicidal ideations today. He reports ongoing severe anxiety symptoms.  He denies any auditory or visual hallucinations or delusional thinking today.    The patient states that he is having ongoing severe back pain secondary to his scoliosis and would like assistance with this.  It was explained that I would not prescribed narcotic medications and the patient agreed with this.  Treatment Plan Summary:  1. Daily contact with patient to assess and evaluate symptoms and progress in treatment.  2. Medication management  3. The patient will deny suicidal ideations or homicidal ideations for 48 hours prior to discharge and have a depression and anxiety rating of 3 or less. The patient will also deny any auditory or visual hallucinations or delusional thinking.  4. The patient will deny any symptoms of substance withdrawal at time of discharge.   Plan:  1. Will increase the medication Celexa to 40 mgs po q am for depression, anxiety and panic symptoms.  2. Will increase the medication Klonopin to 1 mgs po q am, 2 pm and hs for sleep, anxiety and panic symptoms.  3. Will continue the medication Trazodone at 100 mgs po qhs - prn for sleep which can be used at the patient's request.  4. Will start the medication Periactin 4 mgs po qhs for sleep and to improve appetite. 5. Will start the medication Naprosyn 500 mgs po BID-WC for back pain. 6. Laboratory studies reviewed.  7. Will continue to monitor.   Wende Longstreth 06/29/2012, 4:02 PM
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
## 2012-06-29 NOTE — Progress Notes (Signed)
[facility] Adult Inpatient Family/Significant Other Suicide Prevention Education  Suicide Prevention Education:  Education Completed;Cordelia Pen and Cecilia Nishikawa, parents [phone]) has been identified by the patient as the family member/significant other with whom the patient will be residing, and identified as the person(s) who will aid the patient in the event of a mental health crisis (suicidal ideations/suicide attempt).  With written consent from the patient, the family member/significant other has been provided the following suicide prevention education, prior to the and/or following the discharge of the patient.  The suicide prevention education provided includes the following:  Suicide risk factors  Suicide prevention and interventions  National Suicide Hotline telephone number  [facility] assessment telephone number  [facility] Emergency Assistance 911  [facility] and/or Residential Mobile Crisis Unit telephone number  Request made of family/significant other to:  Remove weapons (e.g., guns, rifles, knives), all items previously/currently identified as safety concern.    Remove drugs/medications (over-the-counter, prescriptions, illicit drugs), all items previously/currently identified as a safety concern.  Parents stated that they are very supportive and do not know why Mahonri will not come to live with them. They stated that he is not homeless, as he has a home with them any time he wants to return there. Also stated that they would be glad to take part in counseling with him either in the hospital or on an outpatient basis. Parents verbalized understanding of suicide prevention information and stated that Kipper does not have access to firearms (he used to own a gun that was a family heirloom but sold it within the last year).  Billie Lade 06/29/2012, 3:37 PM

## 2012-06-29 NOTE — Progress Notes (Signed)
D Manuel Dean remains sad, depressed and hopeless about the future. HE is focused on his high anxiety and taking a new medciations that " will bring it down and give me some relief". He is visibly anxious and gets agitated easily when he speaks of his feelings.  A He is compliant with his medications as well as his goups and he writes on his self inventory that he is having " off and on" SI, but he contracts with this nurse for safety. R Safety is maintaeind and POC cont PD RN [facility]
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
## 2012-06-29 NOTE — Progress Notes (Signed)
Psychoeducational Group Note  Date:  06/29/2012 Time: 1100  Group Topic/Focus:  Self Care:   The focus of this group is to help patients understand the importance of self-care in order to improve or restore emotional, physical, spiritual, interpersonal, and financial health.  Participation Level:  Minimal  Participation Quality:  Appropriate  Affect:  Depressed  Cognitive:  Oriented  Insight:  Limited  Engagement in Group:  Limited  Additional Comments:  n/a  Graceann Congress Celcia 06/29/2012, 3:33 PM

## 2012-06-29 NOTE — Progress Notes (Signed)
Psychoeducational Group Note  Date:  06/29/2012 Time:  2005  Group Topic/Focus:  Wrap-Up Group:   The focus of this group is to help patients review their daily goal of treatment and discuss progress on daily workbooks.  Participation Level:  Minimal  Participation Quality:  Attentive  Affect:  Flat  Cognitive:  Appropriate  Insight:  Limited  Engagement in Group:  Good  Additional Comments:  Patient attended wrap-up group tonight.   Ata Pecha, Newton Pigg 06/29/2012, 9:45 PM

## 2012-06-29 NOTE — Progress Notes (Signed)
Patient ID: Manuel Dean, male   DOB: 04-Feb-1991, 21 y.o.   MRN: [id number] D: Pt is awake and active on the unit this AM. Pt denies SI/HI and A/V hallucinations. Pt is participating in the milieu and is cooperative with staff. Pt states that she wants to get to the root of her problem and that she never wants to feel this way again. Pt is trying to understand why she is here now.  A: Writer offered self, utilized therapeutic communication and administered medication per MD orders. Writer provided pt with a journal and encouraged pt to follow her tx plan, write down her questions and concerns and to continue therapy after discharge.   R: Pt is receptive to staff input but states that she is confused about what led her to this point. She does admit that she has unresolved issues including the death of her daughter in 96. Pt mood is depressed and her affect is sad.Writer will continue to monitor. 15 minute checks are ongoing for safety.

## 2012-06-30 MED ORDER — CYPROHEPTADINE HCL 4 MG PO TABS
8.0000 mg | ORAL_TABLET | Freq: Every day | ORAL | Status: DC
  Administered 2012-06-30: 8 mg via ORAL
  Filled 2012-06-30: qty 2
  Filled 2012-06-30: qty 14
  Filled 2012-06-30 (×2): qty 2

## 2012-06-30 NOTE — Progress Notes (Signed)
Psychoeducational Group Note  Date:  06/30/2012 Time:  11:00  Group Topic/Focus:  Recovery Goals:   The focus of this group is to identify appropriate goals for recovery and establish a plan to achieve them.  Participation Level:  Did Not Attend    Additional Comments: Getting used to new meds- very sleepy.  Gwyndolyn Kaufman 06/30/2012, 1:13 PM

## 2012-06-30 NOTE — Progress Notes (Signed)
[facility] Group Notes: (Counselor/Nursing/MHT/Case Management/Adjunct) 06/30/2012   @ 1:15-2:30 Feelings About Diagnosis  Type of Therapy:  Group Therapy  Participation Level:  Active  Participation Quality:  Appropriate, Sharing, Supportive  Affect:  Appropriate  Cognitive:  Appropriate  Insight:  Good  Engagement in Group: Good   Engagement in Therapy:  Good   Modes of Intervention:  Support and Exploration  Summary of Progress/Problems: Manuel Dean had read and prepared for group and was very engaged. He explored his thoughts about diagnosis and symptoms in the context of how he feels about himself. One of the topics he talked about was the way his friends view him for having to take medications, and not knowing whether to play along with their false views of mental illness or to prove them wrong. Manuel Dean processed his experience with family members who do not understand mental health, and how this makes him feel less-than the rest of the family. He did identify that last night, after speaking with counselor, his mother stated that she now recognizes that what he is going through is more than just the blues, which was a powerful statement for him. He talked about the need for support from people who do understand what he has gone through, and finding in himself the empowerment to not only live a full life with depression and anxiety diagnoses, but to use that experience to help himself and others thrive. Manuel Dean recognized that just because he came to get help for his problems does not mean the are bigger or smaller than others' problems, but simply that he made a healthy choice.    Billie Lade 06/30/2012  3:03 PM

## 2012-06-30 NOTE — Progress Notes (Signed)
[facility] MD Progress Note  06/30/2012 7:27 PM  Diagnosis:  Axis I: Major Depressive Disorder - Recurrent.  Generalized Anxiety Disorder.  Panic Disorder without Agoraphobia.   The patient was seen today and reports the following:   ADL's: Intact.  Sleep: The patient reports to having some ongoing difficulty initiating and maintaining sleep.  Appetite: The patient reports a decreased appetite today.   Mild>(1-10) >Severe  Hopelessness (1-10): 10  Depression (1-10): 7-8  Anxiety (1-10): 10   Suicidal Ideation: The patient denies any current suicidal ideations today.  Plan: No  Intent: No  Means: No   Homicidal Ideation: The patient denies any homicidal ideations today.  Plan: No  Intent: No.  Means: No   General Appearance/Behavior: The patient remains cooperative today with this provider.  Eye Contact: Good.  Speech: Appropriate in rate and volume with no pressuring noted today.  Motor Behavior: wnl  Level of Consciousness: Alert and Oriented x 3.  Mental Status: Alert and Oriented x 3.  Mood: Moderate to severely depressed today.  Affect: Moderately constricted.  Anxiety Level: Severe anxiety reported today.  Thought Process: wnl.  Thought Content: The patient denies any auditory or visual hallucinations today as well as any delusions thinking.  Perception: wnl.  Judgment: Fair to good.  Insight: Fair to good.  Cognition: Oriented to person, place and time.  Sleep:  Number of Hours: 6.25    Vital Signs:Blood pressure 115/73, pulse 97, temperature 97.2 F (36.2 C), temperature source Oral, resp. rate 16, height 5\' 5"  (1.651 m), weight 56.246 kg (124 lb).  Current Medications: Current Facility-Administered Medications  Medication Dose Route Frequency Provider Last Rate Last Dose  . acetaminophen (TYLENOL) tablet 650 mg  650 mg Oral Q6H PRN Curlene Labrum Tiara Maultsby, MD   650 mg at 06/26/12 1719  . alum & mag hydroxide-simeth (MAALOX/MYLANTA) 200-200-20 MG/5ML suspension 30 mL  30  mL Oral Q4H PRN Curlene Labrum Alissa Pharr, MD   30 mL at 06/27/12 1313  . citalopram (CELEXA) tablet 40 mg  40 mg Oral Daily Curlene Labrum Kewanda Poland, MD   40 mg at 06/30/12 0742  . clonazePAM (KLONOPIN) tablet 1 mg  1 mg Oral [id number] Curlene Labrum Jeremy Ditullio, MD   1 mg at 06/30/12 1440  . cyproheptadine (PERIACTIN) 4 MG tablet 8 mg  8 mg Oral QHS Curlene Labrum Momen Ham, MD      . hydrOXYzine (ATARAX/VISTARIL) tablet 25 mg  25 mg Oral QID PRN Verne Spurr, PA-C      . hyoscyamine (LEVBID) 0.375 MG 12 hr tablet 0.375 mg  0.375 mg Oral Q12H Verne Spurr, PA-C   0.375 mg at 06/30/12 0744  . magnesium hydroxide (MILK OF MAGNESIA) suspension 30 mL  30 mL Oral Daily PRN Curlene Labrum Breyanna Valera, MD   30 mL at 06/29/12 0930  . multivitamin with minerals tablet 1 tablet  1 tablet Oral Daily Curlene Labrum Fizza Scales, MD   1 tablet at 06/30/12 0742  . naproxen (NAPROSYN) tablet 500 mg  500 mg Oral BID WC Curlene Labrum Raeden Belzer, MD   500 mg at 06/30/12 0743  . nicotine (NICODERM CQ - dosed in mg/24 hours) patch 21 mg  21 mg Transdermal Q0600 Curlene Labrum Arwa Yero, MD   21 mg at 06/30/12 1256  . pramoxine (PROCTOFOAM) 1 % foam   Rectal TID PRN Verne Spurr, PA-C      . traZODone (DESYREL) tablet 100 mg  100 mg Oral QHS PRN Curlene Labrum Izzak Fries, MD   100 mg at 06/28/12 2128  .  DISCONTD: cyproheptadine (PERIACTIN) 4 MG tablet 4 mg  4 mg Oral QHS Curlene Labrum Ellington Cornia, MD       Lab Results: No results found for this or any previous visit (from the past 48 hour(s)).  Review of Systems:  Neurological: The patient denies any headaches today. He denies any seizures or dizziness.  G.I.: The patient denies any constipation or G.I. Upset today. He does reports a history of IBS.  Musculoskeletal: The patient reports chronic back pain today.   Time was spent today discussing with the patient his current symptoms. The patient reports to sleeping reasonably well last night.  He reports his appetite continues to be decreased with moderate feelings of sadness, anhedonia and  depressed mood today. He denies any current suicidal or homicidal ideations today.  He reports his anxiety symptoms are much improvement and describes these as mild.   He denies any auditory or visual hallucinations or delusional thinking today.  The patient denies any medication related side effects today or other concerns.  Treatment Plan Summary:  1. Daily contact with patient to assess and evaluate symptoms and progress in treatment.  2. Medication management  3. The patient will deny suicidal ideations or homicidal ideations for 48 hours prior to discharge and have a depression and anxiety rating of 3 or less. The patient will also deny any auditory or visual hallucinations or delusional thinking.  4. The patient will deny any symptoms of substance withdrawal at time of discharge.   Plan:  1. Will continue the medication Celexa at 40 mgs po q am for depression, anxiety and panic symptoms.  2. Will continue the medication Klonopin at 1 mgs po q am, 2 pm and hs for sleep, anxiety and panic symptoms.  3. Will continue the medication Trazodone at 100 mgs po qhs - prn for sleep which can be used at the patient's request.  4. Will increase the medication Periactin to 8 mgs po qhs for sleep and to improve appetite.  5. Will continue the medication Naprosyn 500 mgs po BID-WC for back pain.  6. Laboratory studies reviewed.  7. Will continue to monitor.   Cianna Kasparian 06/30/2012, 7:27 PM

## 2012-06-30 NOTE — Progress Notes (Signed)
Pt has low energy and requires prompting to get out of bed and attend groups. Pt c/o abdominal pain of a 4. He denies si and hi. Offered support, encouragement and 15 minute checks. Gave pain medication as ordered. Safety maintained on unit.

## 2012-06-30 NOTE — Progress Notes (Addendum)
D: Patient reports depression 3/10 and hopelessness 4/10 tonight.  Patient reports decreased appetite and has been tired this afternoon.  Patient denies SI/HI and denies AVH. A: Staff to monitor Q 15 mins for safety.  Medication administered per MD orders and patient received prn trazodone for sleep. Patient was encouraged to attend group. Encouragement and support offered. R: Patient was late to wrap up group.  Patient stated, "Once I get set up on the right meds, get in the right groups and have money to pay for my meds I will be better." Patient remains safe on the unit and patient's room was moved without a problem.  Patient pleasant and cooperative. Patient resting in bed with eyes closed.

## 2012-06-30 NOTE — Progress Notes (Signed)
Grief and Loss Group   Facilitated a grief and loss group with Manuel Radon McKibben, MS, LPCA, NCC. Writer's supervisor, Dr. Normajean Glasgow Borders, also attended group. Group processed some of the grief and loss issues that they are facing and have faced in their lives. They also processed many of the underlying emotions of grief and loss (e.g., confusion, stress, sadness, etc.). Toward the end of the group, leaders encouraged group members to select a picture that represents their experiences of grief and loss. Group members shared their pictures and supported each other.  Patient was quiet throughout the group, but he seemed to be engaged in others' stories. He elected not to share his picture or share a word about the group at the end. He was respectful and supportive of other group members.  Evelene Croon MA, MED, LPCA, NCC

## 2012-06-30 NOTE — Progress Notes (Addendum)
Pt attended discharge planning group and actively participated.  Pt presents with flat affect and depressed mood.  Pt rates depression at a 5 and anxiety at a 2-3 today.  Pt denies SI.  Pt states that his anxiety has improved with the new medications.  Pt states that he has low energy today and is tired, despite getting good sleep last night.  Pt continues to work on his d/c plan.  Pt states that he has to work on getting things done, such as applying for [employer], [employer], disability and vocational rehab.  Pt states that he is still working on where he will reside upon d/c.  Pt is scheduled with [facility] at this time, but may need to be changed if pt decides to live somewhere else.  No further needs voiced by pt at this time.  Safety planning and suicide prevention discussed.     Reyes Ivan, LCSWA 06/30/2012  9:46 AM

## 2012-06-30 NOTE — Progress Notes (Signed)
Adult Services Patient-Family Contact/Session  Attendees:  Counselor, Mishon's parents Cordelia Pen and Dusty Raczkowski)  Goal(s):  To educate parents on suicide prevention, diagnosis, and ways to be supportive; To gather collateral information  Safety Concerns:  Parents do not have concerns about suicide or safety, but wanted to talk about their concerns about Nirav not coming home  Narrative:  Parents stated that although Mana states he is homeless he is not, because he can come to live with them. Mom went on to say that they do not know why he does not want to live in the home with them, but as she talked she did amend this a little to say that he does not have the same religious beliefs that they do, and this is probably why he does not want to be in the home. She stated that although Giovanne believes that they try to "push it down his throat" they actually only talk about their religious beliefs and tell him they feel sorry that he has different beliefs. Mom stated that she also does not believe in Dannie's use of marijuana or tobacco, and he would not be allowed to do those things or bring any chaos into the home if he returns, as there is a younger brother in the home who has MR and Autism. Parents then described Atif's behavior when he was in their home. Mom stated that Rashard has never talked to them about having anxiety, but that he has left several times in the middle of the night - and sometimes did not return for two or three weeks - so she wonders if this may be the anxiety he is talking about. Counselor explained panic/anxiety attacks and referenced a statement by Aristotelis that he just becomes overwhelmed and cannot take it anymore she he has to do something differently.  Mom stated that she does not understand, because when he leaves the house it is not following an argument, but sometimes when things are going very well. She made a joke about watching out for when things  are going well, and that is when Taron will do something impulsive. Counselor informed her that some people do tend to sabotage things when they are not used to them going well, but counselor cannot say that this is what Samwise was doing. Mom then asked if Mylz's depression was not situational rather than chemical. Counselor explained that both of these variables may have an impact on depression, but based on the long term-symptoms Reon is describing it is likely that there is some chemical basis. Counselor explained chemical imbalances in terms of a switch that gets "stuck" in people with depression, such that a situation may trigger a depressive episode, but the person is unable to end the episode, or that the episode could begin without a trigger. Counselor educated family on suicide prevention information. Parents verbalized undrstanding, though Mom stated that she wonders whether Ikey may be "faking" and asked whether there is any evidence that he actually took the pills. Mom reported that Becker used to have a heirloom gun passed down from his grandfather but that he pawned it, which initially caused hurt in the family, but now she is glad that the gun is out of his possession. She expressed belief that he no longer has any access to firearms. Parents stated that they would be willing to be involved with counseling with [facility], and asked counselor to let him know that they would like for him to come home. Counselor agreed to take  the message to him, and explained that any family counseling would have to be at [facility] request.   Barrier(s): Mong does not want to return home as he states his family does not believe in mental health problems and tell him that if he prays and acts the way they want him to, everything will be better and the depression/anxiety will dissipate.   Interventions:  Counselor educated parents on Ardian's diagnoses and on suicide prevention  information.   Recommendation(s):  Carlosdaniel to remain in inpatient [facility] and follow up with appointments set at discharge by case manager.  Follow-up Required:  No  Explanation:    Billie Lade 06/29/2012  3:02pm

## 2012-06-30 NOTE — Therapy (Signed)
Psychoeducational Group Note  Date:  06/30/2012 Time: 2000  Group Topic/Focus:  Wrap-Up Group:   The focus of this group is to help patients review their daily goal of treatment and discuss progress on daily workbooks.  Participation Level:  Minimal  Participation Quality:  Appropriate  Affect:  Appropriate  Cognitive:  Oriented  Insight:  Limited  Engagement in Group:  Limited  Additional Comments:  Patient was late in attending wrap-up group this evening, however, did attend and participate in the group.  Keandrea Tapley, Newton Pigg 06/30/2012, 11:51 PM

## 2012-07-01 LAB — VITAMIN D 1,25 DIHYDROXY
Vitamin D2 1, 25 (OH)2: <8 pg/mL
Vitamin D3 1, 25 (OH)2: 50 pg/mL

## 2012-07-01 MED ORDER — HYOSCYAMINE SULFATE ER 0.375 MG PO TB12: 0.3750 mg | ORAL_TABLET | Freq: Two times a day (BID) | ORAL | Status: DC

## 2012-07-01 MED ORDER — HYDROXYZINE HCL 25 MG PO TABS: 25.0000 mg | ORAL_TABLET | Freq: Four times a day (QID) | ORAL | Status: AC | PRN

## 2012-07-01 MED ORDER — CYPROHEPTADINE HCL 4 MG PO TABS: 8.0000 mg | ORAL_TABLET | Freq: Every day | ORAL | Status: DC

## 2012-07-01 MED ORDER — CITALOPRAM HYDROBROMIDE 40 MG PO TABS: 40.0000 mg | ORAL_TABLET | Freq: Every day | ORAL | Status: DC

## 2012-07-01 MED ORDER — TRAZODONE HCL 100 MG PO TABS: 100.0000 mg | ORAL_TABLET | Freq: Every evening | ORAL | Status: DC | PRN

## 2012-07-01 NOTE — Progress Notes (Signed)
Psychoeducational Group Note  Date:  07/01/2012 Time:  1100  Group Topic/Focus:  Coping With Mental Health Crisis:   The purpose of this group is to help patients identify strategies for coping with mental health crisis.  Group discusses possible causes of crisis and ways to manage them effectively.  Participation Level:  Active  Participation Quality:  Appropriate and Sharing  Affect:  Appropriate  Cognitive:  Appropriate  Insight:  Good  Engagement in Group:  Good  Additional Comments:  Pt shared a lot of information about how he deals with a mental health crisis.   Sharyn Lull 07/01/2012, 12:18 PM

## 2012-07-01 NOTE — Progress Notes (Signed)
[facility] Group Notes:  (Counselor/Nursing/MHT/Case Management/Adjunct) 07/01/2012  1:15pm-2:30pm Emotion Regulation   Type of Therapy:  Group Therapy  Participation Level:  Did Not Attend/Being Discharged     [name], LCSW 07/01/2012  3:37 PM

## 2012-07-01 NOTE — Tx Team (Signed)
Interdisciplinary Treatment Plan Update (Adult)  Date:  07/01/2012  Time Reviewed:  9:40 AM   Progress in Treatment: Attending groups: Yes Participating in groups:  Yes Taking medication as prescribed: Yes Tolerating medication:  Yes Family/Significant other contact made:  Yes Patient understands diagnosis:  Yes Discussing patient identified problems/goals with staff:  Yes Medical problems stabilized or resolved:  Yes Denies suicidal/homicidal ideation: Yes Issues/concerns per patient self-inventory:  None identified Other: N/A  New problem(s) identified: None Identified  Reason for Continuation of Hospitalization: Stable to d/c  Interventions implemented related to continuation of hospitalization: Stable to d/c  Additional comments: N/A  Estimated length of stay: D/C today  Discharge Plan: Pt will follow up with [facility] for medication management and therapy  New goal(s): N/A  Review of initial/current patient goals per problem list:    1.  Goal(s): Reduce depressive symptoms  Met:  Yes  Target date: by discharge  As evidenced by: Reducing depression from a 10 to a 3 as reported by pt. Pt rates at a 1-2 today.    2.  Goal (s): Reduce/Eliminate suicidal ideation  Met:  Yes  Target date: by discharge  As evidenced by: pt denies SI.   3.  Goal(s): Reduce anxiety symptoms  Met:  Yes  Target date: by discharge  As evidenced by: Reduce anxiety from a 10 to a 3 as reported by pt. Pt rates at a 1-2 today.     Attendees: Patient:  Manuel Dean 07/01/2012 9:41 AM   Family:     Physician: Franchot Gallo, MD  07/01/2012  9:40 AM   Nursing:   Waynetta Sandy, RN 07/01/2012 9:41 AM   Case Manager:  Reyes Ivan, LCSWA 07/01/2012  9:40 AM   Counselor:  [name], LCSW 07/01/2012  9:40 AM   Other:    Other:     Other:     Other:      Scribe for Treatment Team:   Carmina Miller, 07/01/2012 , 9:40 AM

## 2012-07-01 NOTE — Progress Notes (Signed)
Pt reports that he is feeling better today and rates depression as a 3. He reports no anxiety and states that he is thinking about discharging today. Pt states that he has not auditory hallucinations in the past few days and his last recollection of a vivid hallucination was one month ago. Pt medication has made him sleepy throughout the day as he adjusts to changes. Pt is cooperative and attends groups when prompted. Offered support, encouragement and 15 minute checks.He denies si and hi. Safety maintained.

## 2012-07-01 NOTE — Progress Notes (Signed)
Recreation Therapy Group Note  Date: 07/01/2012          Time: 1500      Group Topic/Focus: The focus of this group is on enhancing the patient's understanding of leisure, barriers to leisure, and the importance of engaging in positive leisure activities upon discharge for improved total health.   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    Shalise Rosado 07/01/2012 3:45 PM

## 2012-07-01 NOTE — Progress Notes (Signed)
[facility] Case Management Discharge Plan:  Will you be returning to the same living situation after discharge: Yes,  pt may return to his parents or friend in [facility] At discharge, do you have transportation home?:Yes,  provided pt with a bus pass and [employer] fare Do you have the ability to pay for your medications:Yes,  provided samples   Release of information consent forms completed and in the chart;  Patient's signature needed at discharge.  Patient to Follow up at:  Follow-up Information    Follow up with [facility] on 07/08/2012. (Appointment scheduled at 8:00 am with Dr. Hulen Skains)    Contact information:   [address] [place], [place] [address] [phone]         Patient denies SI/HI:   Yes,  denies SI/HI    Safety Planning and Suicide Prevention discussed:  Yes,  discussed with pt  Barrier to discharge identified:No.  Summary and Recommendations: Pt attended discharge planning group and actively participated.  Pt presents with calm mood and affect.  Pt rates depression and anxiety at a 1-2 today.  Pt denies SI.  Pt reports feeling stable to d/c today.  No recommendations from SW.  No further needs voiced by pt.  Pt stable to discharge.     Carmina Miller 07/01/2012, 9:57 AM

## 2012-07-01 NOTE — Discharge Summary (Signed)
Physician Discharge Summary Note  Patient:  Manuel Dean is an 21 y.o., male MRN:  [id number] DOB:  February 02, 1991 Patient phone:  [phone] (home)  Patient address:   [address] [place] [address],   Date of Admission:  06/25/2012 Date of Discharge: 07/01/12  Reason for Admission: Suicide ideation and attempt.  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 AXIS II:  Deferred AXIS III:   Past Medical History  Diagnosis Date  . Scoliosis   . IBS (irritable bowel syndrome)   . Depression   . Anxiety    AXIS IV:  economic problems, occupational problems, other psychosocial or environmental problems and problems with primary support group AXIS V:  67  Level of Care:  OP  Hospital Course:  This is [name], admitted to [facility] from the [facility] ED with reports of suicide attempts by overdose. This is his first admission to this hospital. Patient reports, "I have severe depression and anxiety. That is why I tried to kill myself and was brought to this hospital. My depression and anxiety started when I was 21 years old. It continued up till today because I did not get the treatment that I needed at that age. My parents do not believe in medicines. They always say that you fight off what ever it is going on with you. I was at the [facility] last month x 3 days for the same reasons. My depression right now is at #5, it is pretty much stable. This is about how high it gets. But my anxiety is at #6. It can go up at any time and can get uncontrollable where I will shut down. Then the  next thing is I will attempt to kill myself and or blank out entirely. I worry about everything and anything. I was recently diagnosed with IBS 3 months ago. This has complicated my situation because of the symptoms. I am constantly having this stabbing, severe cramping kind of pain to my stomach. I will have diarrhea and constipation interchangeably. The only thing that helped my anxiety was Klonopin a friend gave me 8 months ago. Smoking pots also do help me".  While a patient in this hospital, Manuel Dean received medication management for his depressive mood symptoms. He was also enrolled in group counseling sessions and activities. He was ordered and received Citalopram 40 mg daily for depression, Trazodone 100 mg for sleep and hydroxyzine 25 mg for anxiety. He also participated actively in group counseling sessions and activities.  Patient also received medication management and monitoring for his other medical issues and concerns. He tolerated his treatment regimen without any significant adverse effects and or reactions. Although patient is showing improved mood and reporting reduction of symptoms on daily basis, he continue to look forward to his family's validation of his efforts to get well mentally. And for the fact that his family does not recognize the reality of mental illness, and how their attitude has been pray about it and God will take it away, patient more or less feels like an outsider and a stranger in his own home and among his family members.  Patient was commended on his effort  to seek treatment for his mental illness, instead of self medicating with illegal drugs and use of marijuana. He is instructed and encouraged to continue psychiatric care and assure that he takes his medicines as prescribed. He attended treatment team meeting this am and met with the team. His symptoms, treatment regimen and response to treatment discussed. Patient endorsed that he is doing well, and stable for  discharge. He will continue psychiatric care on outpatient basis at [facility] on 07/08/12 @ 08:00 am with Dr. Eulah Pont. The address, date and time for this appointment provided for patient in writing. He also received 2 weeks worth samples of his [facility] discharge medications.   Upon discharge, Manuel Dean adamantly denies suicidal, homicidal ideations, auditory, visual hallucinations and or delusional thinking. He left [facility] with all personal belongings via bus transport. Bus fare provided [facility].   Consults:  None  Significant Diagnostic Studies:  CBC with diff, CMP, Toxicology, UDS  Discharge Vitals:   Blood pressure 102/65, pulse 97, temperature 96.9 F (36.1 C), temperature source Oral, resp. rate 16, height 5\' 5"  (1.651 m), weight 56.246 kg (124 lb).  Mental Status Exam: See Mental Status Examination and Suicide Risk Assessment completed by Attending Physician prior to discharge.  Discharge destination:  Home  Is patient on multiple antipsychotic therapies at discharge:  No   Has Patient had three or more failed trials of antipsychotic monotherapy by history:  No  Recommended Plan for Multiple Antipsychotic Therapies: NA   Medication List  As of 07/01/2012 11:47 AM   STOP taking these medications         ibuprofen 200 MG tablet      OVER THE COUNTER MEDICATION         TAKE these medications      Indication    citalopram 40 MG tablet   Commonly known as: CELEXA   Take 1 tablet (40 mg total) by mouth daily. For depression       hydrOXYzine 25 MG tablet   Commonly known as: ATARAX/VISTARIL   Take 1 tablet (25 mg total) by mouth 4 (four) times daily as needed for anxiety. For anxiety       traZODone 100 MG tablet   Commonly known as: DESYREL   Take 1 tablet (100 mg total) by mouth at bedtime as needed for sleep. For sleep            Follow-up Information    Follow up with [facility] on 07/08/2012. (Appointment scheduled at 8:00 am with Dr. Hulen Skains)    Contact information:   [address], [place] [address] [phone]         Follow-up recommendations:  Activity:  as tolerated Other:  Keep all scheduled follow-up appointments as recommended.   Comments:  Take all your medications as prescribed by your mental healthcare provider. Report any adverse effects and or reactions from your medicines to your outpatient provider promptly. Patient is instructed and cautioned to not engage in alcohol and or illegal drug use while on prescription medicines. In the event of worsening symptoms, patient is instructed to call the crisis hotline, 911 and or go to the nearest ED for appropriate evaluation and treatment of symptoms. Follow-up with your primary care provider for your other medical issues, concerns and or health care needs.    SignedArmandina Stammer I 07/01/2012, 11:47 AM
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
## 2012-07-01 NOTE — Progress Notes (Signed)
1735 Nrsg DC note D Pt given DC instructions via verbal review of AVS. Pt given scripts for his meds as well as sample meds from pharmacy. Pt denies SI, HI, and / or presence of audit, vis, tactile halluc. Pt states he has anxiety  That he rates " 3-4" and says this is his " major problem". He completed his self inventory today and on it he wrote he denies SI, he rated his depression and hopelessness " 2-3 / 3"  He shared his DC plan is to get to [facility] this evening and spend the night with a friend.Marland Kitchenand then tomorrow he will decide if he wants to / can return to his  Parent's home..* No surgery found * if he will go to the shelter to stay while he looks for employment.  A HE shared that while a pt here in the hospital, he learned that " I'm not the only one" and  " I'm not crazy". R Safety is maintained. DC plan completed and pt given bus ticket from [place] to [employer] . All belongings returned to pt and pt escorted to bldg entrance and DC'd PD RN bc

## 2012-07-01 NOTE — Progress Notes (Signed)
[facility] Group Notes:  (Counselor/Nursing/MHT/Case Management/Adjunct)  07/01/2012 11:39 AM  Type of Therapy:  Psychoeducational Skills  Participation Level:  Active  Participation Quality:  Attentive and Drowsy  Affect:  Depressed  Cognitive:  Appropriate and Oriented  Insight:  Good  Engagement in Group:  Good  Engagement in Therapy:  n/a  Modes of Intervention:  Activity, Education, Problem-solving, Socialization and Support  Summary of Progress/Problems: Manuel Dean attended Psychoeducational group on labels. 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 Manuel Dean was active while group discussed what labels are, how we use them, and listed positive and negative labels they have used or been called. Manuel Dean was given a homework assignment to list 10 words he has been labeled to find the reality of the situation/label.     Wandra Scot 07/01/2012, 11:39 AM

## 2012-07-03 NOTE — Progress Notes (Signed)
Patient Discharge Instructions:  After Visit Summary (AVS):   Faxed to:  07/03/2012 Psychiatric Admission Assessment Note:   Faxed to:  07/03/2012 Suicide Risk Assessment - Discharge Assessment:   Faxed to:  07/03/2012 Faxed/Sent to the Next Level Care provider:  07/03/2012  Faxed to [facility] - Dr. Eulah Pont @ [phone]  Heloise Purpura Eduard Clos, 07/03/2012, 2:35 PM

## 2014-08-25 ENCOUNTER — Ambulatory Visit (INDEPENDENT_AMBULATORY_CARE_PROVIDER_SITE_OTHER): Payer: Managed Care, Other (non HMO) | Admitting: Family Medicine

## 2014-08-25 VITALS — BP 110/74 | HR 68 | Temp 98.2°F | Resp 16 | Ht 66.0 in | Wt 133.0 lb

## 2014-08-25 DIAGNOSIS — R1084 Generalized abdominal pain: Secondary | ICD-10-CM

## 2014-08-25 DIAGNOSIS — Z716 Tobacco abuse counseling: Secondary | ICD-10-CM

## 2014-08-25 DIAGNOSIS — L989 Disorder of the skin and subcutaneous tissue, unspecified: Secondary | ICD-10-CM

## 2014-08-25 DIAGNOSIS — Z7189 Other specified counseling: Secondary | ICD-10-CM

## 2014-08-25 DIAGNOSIS — K625 Hemorrhage of anus and rectum: Secondary | ICD-10-CM

## 2014-08-25 DIAGNOSIS — IMO0002 Reserved for concepts with insufficient information to code with codable children: Secondary | ICD-10-CM

## 2014-08-25 DIAGNOSIS — F17200 Nicotine dependence, unspecified, uncomplicated: Secondary | ICD-10-CM

## 2014-08-25 DIAGNOSIS — L02413 Cutaneous abscess of right upper limb: Secondary | ICD-10-CM

## 2014-08-25 LAB — POCT CBC
GRANULOCYTE PERCENT: 50.5 % (ref 37–80)
HEMATOCRIT: 49.9 % (ref 43.5–53.7)
Hemoglobin: 16.1 g/dL (ref 14.1–18.1)
Lymph, poc: 2.2 (ref 0.6–3.4)
MCH: 30.5 pg (ref 27–31.2)
MCHC: 32.2 g/dL (ref 31.8–35.4)
MCV: 94.8 fL (ref 80–97)
MID (CBC): 0.4 (ref 0–0.9)
MPV: 7.5 fL (ref 0–99.8)
PLATELET COUNT, POC: 200 10*3/uL (ref 142–424)
POC Granulocyte: 2.6 (ref 2–6.9)
POC LYMPH %: 42.3 % (ref 10–50)
POC MID %: 7.2 %M (ref 0–12)
RBC: 5.27 M/uL (ref 4.69–6.13)
RDW, POC: 13.1 %
WBC: 5.2 10*3/uL (ref 4.6–10.2)

## 2014-08-25 LAB — COMPREHENSIVE METABOLIC PANEL
ALK PHOS: 54 U/L (ref 39–117)
ALT: 12 U/L (ref 0–53)
AST: 18 U/L (ref 0–37)
Albumin: 4.6 g/dL (ref 3.5–5.2)
BILIRUBIN TOTAL: 0.6 mg/dL (ref 0.2–1.2)
BUN: 10 mg/dL (ref 6–23)
CO2: 24 meq/L (ref 19–32)
Calcium: 9.2 mg/dL (ref 8.4–10.5)
Chloride: 105 mEq/L (ref 96–112)
Creat: 1.05 mg/dL (ref 0.50–1.35)
Glucose, Bld: 83 mg/dL (ref 70–99)
Potassium: 4.5 mEq/L (ref 3.5–5.3)
SODIUM: 138 meq/L (ref 135–145)
TOTAL PROTEIN: 6.8 g/dL (ref 6.0–8.3)

## 2014-08-25 LAB — POCT SEDIMENTATION RATE: POCT SED RATE: 3 mm/hr (ref 0–22)

## 2014-08-25 MED ORDER — VALACYCLOVIR HCL 1 G PO TABS: 1000.0000 mg | ORAL_TABLET | Freq: Three times a day (TID) | ORAL | Status: DC

## 2014-08-25 MED ORDER — DICYCLOMINE HCL 10 MG PO CAPS: 10.0000 mg | ORAL_CAPSULE | Freq: Three times a day (TID) | ORAL | Status: DC

## 2014-08-25 MED ORDER — MUPIROCIN 2 % EX OINT: 1.0000 "application " | TOPICAL_OINTMENT | Freq: Three times a day (TID) | CUTANEOUS | Status: DC

## 2014-08-25 NOTE — Progress Notes (Signed)
Subjective: 23 year old male with a history of having abdominal pain problems for the last several years. He was evaluated by a gastroenterologist, had a colonoscopy, and was told he had irritable bowel syndrome. Additional testing was not done at that time apparently due to insurance issues. He now has insurance and hasn't seen a primary care doctor who is establishing him at home with gastroenterologist when she gets his records together. Has been about a month ago. He continues to pass blood, intermittently, sometimes more and sometimes less. It is red blood mixed in with the stool. He has chronic abdominal pains across the abdomen in both upper quadrants and down both sides. He does not have a lot of diarrhea. He has been taking some hycosamine intermittently for discomfort, but that really doesn't get good relief. He has had problems with vomiting. That before he went in the hospital several years ago for this he had vomited a lot, was not keeping anything down, and was down to about 85 pounds. He's also recently been having problems with pain down his right arm the last 2 days, from the shoulder down to the lateral aspect of the right arm and hand. He then about the same time broke out with a sore right hand at the base of the right fifth metacarpal. Other than the medicine for his bowel, he's not on any other regular medications. He does smoke and attends with use some marijuana.  Objective: Alert healthy appearing young man in no major distress. He has tenderness of the upper arm, not much in the shoulder area. No rashes down the arm. He does have a single erythematous area at the base of the right fifth metacarpal which is about 1 cm in diameter and has a crust on it. Finger otherwise appears normal.  Abdomen has bowel sounds present. Is soft. Diffusely tender.  Assessment: Abdominal pain Rectal bleeding Skin infection right hand, staph versus possible shingles Right ulnar neuropathic  pain  Plan:  Valacyclovir Bactrim CBC sedimentation rate C. MetHe needs to get back to his PCP and  asked if  she has gotten scheduled for the after neurology evaluation.   Results for orders placed in visit on 08/25/14  POCT CBC      Result Value Ref Range   WBC 5.2  4.6 - 10.2 K/uL   Lymph, poc 2.2  0.6 - 3.4   POC LYMPH PERCENT 42.3  10 - 50 %L   MID (cbc) 0.4  0 - 0.9   POC MID % 7.2  0 - 12 %M   POC Granulocyte 2.6  2 - 6.9   Granulocyte percent 50.5  37 - 80 %G   RBC 5.27  4.69 - 6.13 M/uL   Hemoglobin 16.1  14.1 - 18.1 g/dL   HCT, POC 95.2  84.1 - 53.7 %   MCV 94.8  80 - 97 fL   MCH, POC 30.5  27 - 31.2 pg   MCHC 32.2  31.8 - 35.4 g/dL   RDW, POC 32.4     Platelet Count, POC 200  142 - 424 K/uL   MPV 7.5  0 - 99.8 fL   A long talk with him about stopping smoking. Urged him to pick a date tapered down to about half a dozen cigarettes, and quit. If he finds it too extremely difficult and we need to consider patches or something. With his depression history of healthy he is a good candidate for Chantix.  We'll try some dicyclomine for  the bowel while he is waiting to see a gastroenterologist.

## 2014-08-25 NOTE — Patient Instructions (Signed)
Contact your primary care doctor and find out the status of your gastroenterology referral  Take Bentyl (dicyclomine) 10 mg one before meals and at bedtime as needed and see how it does you. If it makes her worse discontinued promptly.  Use the mupirocin ointment on the place of her hand.  Take the valacyclovir 1 pill 3 times daily for one week for possible shingles  Return if not improving

## 2014-08-28 LAB — WOUND CULTURE
GRAM STAIN: NONE SEEN
GRAM STAIN: NONE SEEN
Gram Stain: NONE SEEN

## 2014-09-01 ENCOUNTER — Encounter: Payer: Self-pay | Admitting: *Deleted

## 2014-09-01 ENCOUNTER — Telehealth: Payer: Self-pay | Admitting: *Deleted

## 2014-09-01 MED ORDER — AMOXICILLIN 875 MG PO TABS: 875.0000 mg | ORAL_TABLET | Freq: Two times a day (BID) | ORAL | Status: DC

## 2014-09-01 NOTE — Telephone Encounter (Signed)
Pt came in to request an out of work note because wound on hand still draining.  Did a note for 2 days and sent in rx for antibiotic.

## 2014-10-10 ENCOUNTER — Ambulatory Visit (INDEPENDENT_AMBULATORY_CARE_PROVIDER_SITE_OTHER): Payer: Managed Care, Other (non HMO)

## 2014-10-10 ENCOUNTER — Ambulatory Visit (INDEPENDENT_AMBULATORY_CARE_PROVIDER_SITE_OTHER): Payer: Managed Care, Other (non HMO) | Admitting: Family Medicine

## 2014-10-10 VITALS — BP 98/88 | HR 57 | Temp 98.3°F | Resp 18 | Wt 131.0 lb

## 2014-10-10 DIAGNOSIS — M79602 Pain in left arm: Principal | ICD-10-CM

## 2014-10-10 DIAGNOSIS — R202 Paresthesia of skin: Secondary | ICD-10-CM

## 2014-10-10 DIAGNOSIS — M62838 Other muscle spasm: Secondary | ICD-10-CM

## 2014-10-10 DIAGNOSIS — M79603 Pain in arm, unspecified: Secondary | ICD-10-CM

## 2014-10-10 DIAGNOSIS — M79601 Pain in right arm: Secondary | ICD-10-CM

## 2014-10-10 DIAGNOSIS — M6248 Contracture of muscle, other site: Secondary | ICD-10-CM

## 2014-10-10 DIAGNOSIS — K589 Irritable bowel syndrome without diarrhea: Secondary | ICD-10-CM

## 2014-10-10 MED ORDER — METAXALONE 800 MG PO TABS: 400.0000 mg | ORAL_TABLET | Freq: Three times a day (TID) | ORAL | Status: DC

## 2014-10-10 NOTE — Progress Notes (Signed)
IDENTIFYING INFORMATION  Manuel Dean / male / 1991-04-08 / 23 y.o. / MRN: [id number]  The patient has Major depressive disorder, recurrent episode; Generalized anxiety disorder; Panic disorder without agoraphobia with moderate panic attacks; Cannabis abuse; and Irritable bowel disease on his problem list.  SUBJECTIVE  Chief Complaint: had a chief complaint of Arm Pain and an additional complaint of left arm pain.   [name] is a 23 yo right handed male who reports having bilateral numbness and tingling that radiates from his neck, down his arms, and into his thumb index, and middle fingers x 2 weeks.  He reports that it is not terribly painful, but uncomfortable. He reports the pain is transient, and not associated with particular movements.  About three weeks ago, he was working with a dough oven (he works at [employer]) and the door crashed down on his head. The oven is apparently at head level. He denies having neck pain from this, but this is however in the setting of chronic spine pain secondary to scoliosis.   He has a history of IBS, and reports some bleeding on found on colonoscopy roughly 5 years ago.Marland Kitchen.  He has tried dicloxamine for this problem, and reports that it helped with his pain, but caused him to have "blurred vision."  He denies a history of gluten sensitivity, reporting that  actually helps with his diarrhea and constipation.    He is allergic to cashew nut oil and sulfa antibiotics.  The patient currently has no medications in their medication list.  The patient has  has past surgical history that includes multiple leg surgeries.  The patient has  reports that he has been smoking Cigarettes.  He has a .5 pack-year smoking history. He has never used smokeless tobacco. He reports that he drinks alcohol. He reports that he uses illicit drugs (Marijuana). and his family history is not on file. He was adopted.  Review of Systems  Constitutional: Negative.     HENT: Negative.   Eyes: Negative.   Respiratory: Negative.   Cardiovascular: Negative.   Musculoskeletal: Positive for myalgias.  Skin: Negative.     OBJECTIVE  Blood pressure 98/88, pulse 57, temperature 98.3 F (36.8 C), temperature source Oral, resp. rate 18, weight 131 lb (59.421 kg), SpO2 98.00%.  Physical Exam  Constitutional: He is oriented to person, place, and time and well-developed, well-nourished, and in no distress.  Neck: Muscular tenderness present.  Cardiovascular: Normal rate.   Pulmonary/Chest: Effort normal.  Musculoskeletal: Normal range of motion.       Cervical back: He exhibits tenderness, pain and spasm. He exhibits no swelling, no edema and no deformity.  Neurological: He is alert and oriented to person, place, and time. He has normal motor skills and intact cranial nerves. No cranial nerve deficit. Gait normal.  Reflex Scores:      Tricep reflexes are 2+ on the right side and 2+ on the left side.      Bicep reflexes are 1+ on the right side and 1+ on the left side.      Brachioradialis reflexes are 2+ on the right side and 2+ on the left side.      Patellar reflexes are 2+ on the right side and 2+ on the left side.      Achilles reflexes are 2+ on the right side and 2+ on the left side. Skin: Skin is warm, dry and intact. He is not diaphoretic.  Psychiatric: Mood, memory, affect and judgment  normal.   Complete cervical rad (preliminary) read by Dr. Clelia CroftShaw.  No acute abnormalities.  Some mild loss of the cervical lordosis, and question of mild anterior spondyloithstisis of C5 on C6.  ASSESSMENT & PLAN  Muscle spasms of the neck complicated by paresthesia and pain of both upper extremities - Plan: DG Cervical Spine 2 or 3 views, metaxalone (SKELAXIN) 800 MG tablet. NSAIDs contraindicated in this patient given the history of GI bleeding reported in HPI.  Patient advised to start using cold packs to reduce inflammation in the neck.  Advised to use more caution  at work.  Should this problem get worse upper extremity nerve conduction studies would be helpful.      Irritable bowel disease - Plan: Ambulatory referral to Gastroenterology, referred to Dr. Elnoria HowardHung.    The patient was instructed to to call or comeback to clinic as needed, or should symptoms warrant.  Deliah BostonMichael Clark, MS, PA-C [facility] 10/10/2014 5:04 PM

## 2014-10-10 NOTE — Patient Instructions (Signed)
Use cold packs in the place of Ibuprofen given your existing GI symptoms.

## 2014-10-27 NOTE — Progress Notes (Signed)
Reviewed xray, documentation and agree w/ assessment and plan. Eva Shaw, MD MPH  

## 2014-11-01 ENCOUNTER — Telehealth: Payer: Self-pay | Admitting: Physician Assistant

## 2014-11-01 NOTE — Telephone Encounter (Signed)
-----   Message from Eva N Shaw, MD sent at 10/28/2014  6:20 PM EDT ----- Regarding: RE: Neck pain patient from 10/13 xray showed spondylolithstesis  Amelya Mabry - Sorry I didn't see this prior.  Reviewed chart - I agree w/ you.  Hopefully pt is better by now.  If not, refer to PT. If any neurologic sxs (numbness or weakness in arms) develop/worsens or if any pain continues after 6 wks in PT, then pt should RTC for recheck so we can order MRI.  Usually to get insurance to cover an MRI you need an OV documenting pt has completed and failed 6 wks of nsaids or PT or concerning neuro sxs. Eva ----- Message -----    From: Keegan Bensch Lee Ramesh Moan, PA-C    Sent: 10/12/2014   2:12 PM      To: Eva N Shaw, MD Subject: Neck pain patient from 10/13 xray showed spo#  Dr. Shaw,  The radiologists agreed with your read on this patient and it sounds like they are lightly recommending an MRI for further eval of ligamentous instability.  I have not yet spoken with the patient regarding his symptoms after we started him on Skelaxin.  My thoughts were to 1, do nothing, 2 proceed with MRI or 3 prescribe physical therapy. Obviously I would speak to the patient first.  I feel like an MRI may be a little overkill given that his symptoms were mild in the clinic. I await your advise.  (xray read is included below).  CLINICAL DATA:  Bilateral numbness and tingling that radiates from neck to or arms. Initial encounter. Heavy steel door fell on patient at work, neck pain.   EXAM: CERVICAL SPINE - 2-3 VIEW   COMPARISON:  None.    FINDINGS: No prevertebral soft tissue swelling. Normal alignment of the vertebral bodies. On the swimmer's view there is mild anterolisthesis of C 5 on C6 (1-2 mm). Normal facet articulation. Normal spinal laminal line. Open mouth odontoid view demonstrates normal alignment of the lateral masses of C1 on C2.   IMPRESSION: No radiographic evidence cervical spine fracture.   Mild anterolisthesis of C5  on C6 could indicate mild instability. Consider MRI if concern for ligamentous injury.     Electronically Signed   By: Stewart  Edmunds M.D.   On: 10/10/2014 18:16   

## 2014-11-01 NOTE — Telephone Encounter (Signed)
Tried to call patient at the number listed and was received with automated voicemail.  Did not leave a message d/t concern for hippa.  [facility]  Deliah BostonMichael Alcus Bradly, MS, PA-C   12:33 PM, 11/01/2014

## 2014-11-06 ENCOUNTER — Telehealth: Payer: Self-pay | Admitting: Physician Assistant

## 2014-11-06 NOTE — Telephone Encounter (Signed)
Called pt and left message for him to call [facility] back regarding this issue.  Deliah BostonMichael Hosanna Betley, MHS, PA-C [facility] 11/06/2014 8:22 AM

## 2014-11-06 NOTE — Telephone Encounter (Signed)
-----   Message from Sherren MochaEva N Shaw, MD sent at 10/28/2014  6:20 PM EDT ----- Regarding: RE: Neck pain patient from 10/13 xray showed spondylolithstesis  Casimiro NeedleMichael - Sorry I didn't see this prior.  Reviewed chart - I agree w/ you.  Hopefully pt is better by now.  If not, refer to PT. If any neurologic sxs (numbness or weakness in arms) develop/worsens or if any pain continues after 6 wks in PT, then pt should RTC for recheck so we can order MRI.  Usually to get insurance to cover an MRI you need an OV documenting pt has completed and failed 6 wks of nsaids or PT or concerning neuro sxs. Carley HammedEva ----- Message -----    From: Dolores LoryMichael Lee Vedika Dumlao, PA-C    Sent: 10/12/2014   2:12 PM      To: Sherren MochaEva N Shaw, MD Subject: Neck pain patient from 10/13 xray showed spo#  Dr. Clelia CroftShaw,  The radiologists agreed with your read on this patient and it sounds like they are lightly recommending an MRI for further eval of ligamentous instability.  I have not yet spoken with the patient regarding his symptoms after we started him on Skelaxin.  My thoughts were to 1, do nothing, 2 proceed with MRI or 3 prescribe physical therapy. Obviously I would speak to the patient first.  I feel like an MRI may be a little overkill given that his symptoms were mild in the clinic. I await your advise.  (xray read is included below).  CLINICAL DATA:  Bilateral numbness and tingling that radiates from neck to or arms. Initial encounter. Heavy steel door fell on patient at work, neck pain.   EXAM: CERVICAL SPINE - 2-3 VIEW   COMPARISON:  None.    FINDINGS: No prevertebral soft tissue swelling. Normal alignment of the vertebral bodies. On the swimmer's view there is mild anterolisthesis of C 5 on C6 (1-2 mm). Normal facet articulation. Normal spinal laminal line. Open mouth odontoid view demonstrates normal alignment of the lateral masses of C1 on C2.   IMPRESSION: No radiographic evidence cervical spine fracture.   Mild anterolisthesis of C5  on C6 could indicate mild instability. Consider MRI if concern for ligamentous injury.     Electronically Signed   By: Genevive BiStewart  Edmunds M.D.   On: 10/10/2014 18:16

## 2014-11-17 ENCOUNTER — Encounter: Payer: Self-pay | Admitting: Physician Assistant

## 2014-12-16 ENCOUNTER — Encounter (HOSPITAL_COMMUNITY): Payer: Self-pay | Admitting: Emergency Medicine

## 2014-12-16 ENCOUNTER — Emergency Department (HOSPITAL_COMMUNITY)
Admission: EM | Admit: 2014-12-16 | Discharge: 2014-12-16 | Disposition: A | Discharge status: Home / Self Care | Payer: Managed Care, Other (non HMO) | Attending: Emergency Medicine | Admitting: Emergency Medicine

## 2014-12-16 DIAGNOSIS — R109 Unspecified abdominal pain: Secondary | ICD-10-CM | POA: Diagnosis present

## 2014-12-16 DIAGNOSIS — Z79899 Other long term (current) drug therapy: Secondary | ICD-10-CM | POA: Diagnosis not present

## 2014-12-16 DIAGNOSIS — M419 Scoliosis, unspecified: Secondary | ICD-10-CM | POA: Diagnosis not present

## 2014-12-16 DIAGNOSIS — Z720 Tobacco use: Secondary | ICD-10-CM | POA: Insufficient documentation

## 2014-12-16 DIAGNOSIS — K625 Hemorrhage of anus and rectum: Secondary | ICD-10-CM | POA: Diagnosis not present

## 2014-12-16 DIAGNOSIS — Z8659 Personal history of other mental and behavioral disorders: Secondary | ICD-10-CM | POA: Diagnosis not present

## 2014-12-16 DIAGNOSIS — M542 Cervicalgia: Secondary | ICD-10-CM | POA: Insufficient documentation

## 2014-12-16 DIAGNOSIS — R195 Other fecal abnormalities: Secondary | ICD-10-CM | POA: Diagnosis not present

## 2014-12-16 LAB — COMPREHENSIVE METABOLIC PANEL
ALT: 10 U/L (ref 0–53)
AST: 16 U/L (ref 0–37)
Albumin: 4.5 g/dL (ref 3.5–5.2)
Alkaline Phosphatase: 70 U/L (ref 39–117)
Anion gap: 11 (ref 5–15)
BUN: 9 mg/dL (ref 6–23)
CALCIUM: 9.7 mg/dL (ref 8.4–10.5)
CHLORIDE: 102 meq/L (ref 96–112)
CO2: 28 meq/L (ref 19–32)
Creatinine, Ser: 1.07 mg/dL (ref 0.50–1.35)
GFR calc Af Amer: >90 mL/min (ref >90)
GFR calc non Af Amer: >90 mL/min (ref >90)
Glucose, Bld: 94 mg/dL (ref 70–99)
Potassium: 4.8 mEq/L (ref 3.7–5.3)
Sodium: 141 mEq/L (ref 137–147)
Total Bilirubin: 0.4 mg/dL (ref 0.3–1.2)
Total Protein: 7.5 g/dL (ref 6.0–8.3)

## 2014-12-16 LAB — CBC WITH DIFFERENTIAL/PLATELET
BASOS ABS: 0 10*3/uL (ref 0.0–0.1)
BASOS PCT: 1 % (ref 0–1)
EOS PCT: 6 % — AB (ref 0–5)
Eosinophils Absolute: 0.3 10*3/uL (ref 0.0–0.7)
HEMATOCRIT: 46.5 % (ref 39.0–52.0)
HEMOGLOBIN: 16.8 g/dL (ref 13.0–17.0)
Lymphocytes Relative: 38 % (ref 12–46)
Lymphs Abs: 1.9 10*3/uL (ref 0.7–4.0)
MCH: 32.7 pg (ref 26.0–34.0)
MCHC: 36.1 g/dL — AB (ref 30.0–36.0)
MCV: 90.6 fL (ref 78.0–100.0)
MONO ABS: 0.4 10*3/uL (ref 0.1–1.0)
MONOS PCT: 8 % (ref 3–12)
Neutro Abs: 2.4 10*3/uL (ref 1.7–7.7)
Neutrophils Relative %: 47 % (ref 43–77)
Platelets: 175 10*3/uL (ref 150–400)
RBC: 5.13 MIL/uL (ref 4.22–5.81)
RDW: 12.6 % (ref 11.5–15.5)
WBC: 5.1 10*3/uL (ref 4.0–10.5)

## 2014-12-16 LAB — POC OCCULT BLOOD, ED: FECAL OCCULT BLD: POSITIVE — AB

## 2014-12-16 NOTE — ED Notes (Signed)
Pt sts abd pain with some rectal bleeding x years from IBS; pt sts worse over last several weeks; pt sts some neck pain recently from issue with disc in his neck

## 2014-12-16 NOTE — Discharge Instructions (Signed)
It was our pleasure to provide your ER care today - we hope that you feel better.  Rest. Drink plenty of fluids.   Take imodium as need if diarrhea.  Follow up with gi specialist in the next 1-2 weeks - see referral - call office Monday morning to arrange appointment.  Return to ER if worse, new symptoms, fevers, persistent vomiting, other concern.    Rectal Bleeding Rectal bleeding is when blood passes out of the anus. It is usually a sign that something is wrong. It may not be serious, but it should always be evaluated. Rectal bleeding may present as bright red blood or extremely dark stools. The color may range from dark red or maroon to black (like tar). It is important that the cause of rectal bleeding be identified so treatment can be started and the problem corrected. CAUSES   Hemorrhoids. These are enlarged (dilated) blood vessels or veins in the anal or rectal area.  Fistulas. Theseare abnormal, burrowing channels that usually run from inside the rectum to the skin around the anus. They can bleed.  Anal fissures. This is a tear in the tissue of the anus. Bleeding occurs with bowel movements.  Diverticulosis. This is a condition in which pockets or sacs project from the bowel wall. Occasionally, the sacs can bleed.  Diverticulitis. Thisis an infection involving diverticulosis of the colon.  Proctitis and colitis. These are conditions in which the rectum, colon, or both, can become inflamed and pitted (ulcerated).  Polyps and cancer. Polyps are non-cancerous (benign) growths in the colon that may bleed. Certain types of polyps turn into cancer.  Protrusion of the rectum. Part of the rectum can project from the anus and bleed.  Certain medicines.  Intestinal infections.  Blood vessel abnormalities. HOME CARE INSTRUCTIONS  Eat a high-fiber diet to keep your stool soft.  Limit activity.  Drink enough fluids to keep your urine clear or pale yellow.  Warm baths may be  useful to soothe rectal pain.  Follow up with your caregiver as directed. SEEK IMMEDIATE MEDICAL CARE IF:  You develop increased bleeding.  You have black or dark red stools.  You vomit blood or material that looks like coffee grounds.  You have abdominal pain or tenderness.  You have a fever.  You feel weak, nauseous, or you faint.  You have severe rectal pain or you are unable to have a bowel movement. MAKE SURE YOU:  Understand these instructions.  Will watch your condition.  Will get help right away if you are not doing well or get worse. Document Released: 06/07/2002 Document Revised: 03/09/2012 Document Reviewed: 06/02/2011 [facility] Patient Information 2015 [place], [place]. This information is not intended to replace advice given to you by your health care provider. Make sure you discuss any questions you have with your health care provider.     Diarrhea Diarrhea is frequent loose and watery bowel movements. It can cause you to feel weak and dehydrated. Dehydration can cause you to become tired and thirsty, have a dry mouth, and have decreased urination that often is dark yellow. Diarrhea is a sign of another problem, most often an infection that will not last long. In most cases, diarrhea typically lasts 2-3 days. However, it can last longer if it is a sign of something more serious. It is important to treat your diarrhea as directed by your caregiver to lessen or prevent future episodes of diarrhea. CAUSES  Some common causes include:  Gastrointestinal infections caused by viruses, bacteria,  or parasites.  Food poisoning or food allergies.  Certain medicines, such as antibiotics, chemotherapy, and laxatives.  Artificial sweeteners and fructose.  Digestive disorders. HOME CARE INSTRUCTIONS  Ensure adequate fluid intake (hydration): Have 1 cup (8 oz) of fluid for each diarrhea episode. Avoid fluids that contain simple sugars or sports drinks, fruit juices, whole  milk products, and sodas. Your urine should be clear or pale yellow if you are drinking enough fluids. Hydrate with an oral rehydration solution that you can purchase at pharmacies, retail stores, and online. You can prepare an oral rehydration solution at home by mixing the following ingredients together:   - tsp table salt.   tsp baking soda.   tsp salt substitute containing potassium chloride.  1  tablespoons sugar.  1 L (34 oz) of water.  Certain foods and beverages may increase the speed at which food moves through the gastrointestinal (GI) tract. These foods and beverages should be avoided and include:  Caffeinated and alcoholic beverages.  High-fiber foods, such as raw fruits and vegetables, nuts, seeds, and whole grain breads and cereals.  Foods and beverages sweetened with sugar alcohols, such as xylitol, sorbitol, and mannitol.  Some foods may be well tolerated and may help thicken stool including:  Starchy foods, such as rice, toast, pasta, low-sugar cereal, oatmeal, grits, baked potatoes, crackers, and bagels.  Bananas.  Applesauce.  Add probiotic-rich foods to help increase healthy bacteria in the GI tract, such as yogurt and fermented milk products.  Wash your hands well after each diarrhea episode.  Only take over-the-counter or prescription medicines as directed by your caregiver.  Take a warm bath to relieve any burning or pain from frequent diarrhea episodes. SEEK IMMEDIATE MEDICAL CARE IF:   You are unable to keep fluids down.  You have persistent vomiting.  You have blood in your stool, or your stools are black and tarry.  You do not urinate in 6-8 hours, or there is only a small amount of very dark urine.  You have abdominal pain that increases or localizes.  You have weakness, dizziness, confusion, or light-headedness.  You have a severe headache.  Your diarrhea gets worse or does not get better.  You have a fever or persistent symptoms for  more than 2-3 days.  You have a fever and your symptoms suddenly get worse. MAKE SURE YOU:   Understand these instructions.  Will watch your condition.  Will get help right away if you are not doing well or get worse. Document Released: 12/06/2002 Document Revised: 05/02/2014 Document Reviewed: 08/23/2012 [facility] Patient Information 2015 [place], [place]. This information is not intended to replace advice given to you by your health care provider. Make sure you discuss any questions you have with your health care provider.    Irritable Bowel Syndrome Irritable bowel syndrome (IBS) is caused by a disturbance of normal bowel function and is a common digestive disorder. You may also hear this condition called spastic colon, mucous colitis, and irritable colon. There is no cure for IBS. However, symptoms often gradually improve or disappear with a good diet, stress management, and medicine. This condition usually appears in late adolescence or early adulthood. Women develop it twice as often as men. CAUSES  After food has been digested and absorbed in the small intestine, waste material is moved into the large intestine, or colon. In the colon, water and salts are absorbed from the undigested products coming from the small intestine. The remaining residue, or fecal material, is held for  elimination. Under normal circumstances, gentle, rhythmic contractions of the bowel walls push the fecal material along the colon toward the rectum. In IBS, however, these contractions are irregular and poorly coordinated. The fecal material is either retained too long, resulting in constipation, or expelled too soon, producing diarrhea. SIGNS AND SYMPTOMS  The most common symptom of IBS is abdominal pain. It is often in the lower left side of the abdomen, but it may occur anywhere in the abdomen. The pain comes from spasms of the bowel muscles happening too much and from the buildup of gas and fecal material in the  colon. This pain:  Can range from sharp abdominal cramps to a dull, continuous ache.  Often worsens soon after eating.  Is often relieved by having a bowel movement or passing gas. Abdominal pain is usually accompanied by constipation, but it may also produce diarrhea. The diarrhea often occurs right after a meal or upon waking up in the morning. The stools are often soft, watery, and flecked with mucus. Other symptoms of IBS include:  Bloating.  Loss of appetite.  Heartburn.  Backache.  Dull pain in the arms or shoulders.  Nausea.  Burping.  Vomiting.  Gas. IBS may also cause symptoms that are unrelated to the digestive system, such as:  Fatigue.  Headaches.  Anxiety.  Shortness of breath.  Trouble concentrating.  Dizziness. These symptoms tend to come and go. DIAGNOSIS  The symptoms of IBS may seem like symptoms of other, more serious digestive disorders. Your health care provider may want to perform tests to exclude these disorders.  TREATMENT Many medicines are available to help correct bowel function or relieve bowel spasms and abdominal pain. Among the medicines available are:  Laxatives for severe constipation and to help restore normal bowel habits.  Specific antidiarrheal medicines to treat severe or lasting diarrhea.  Antispasmodic agents to relieve intestinal cramps. Your health care provider may also decide to treat you with a mild tranquilizer or sedative during unusually stressful periods in your life. Your health care provider may also prescribe antidepressant medicine. The use of this medicine has been shown to reduce pain and other symptoms of IBS. Remember that if any medicine is prescribed for you, you should take it exactly as directed. Make sure your health care provider knows how well it worked for you. HOME CARE INSTRUCTIONS   Take all medicines as directed by your health care provider.  Avoid foods that are high in fat or oils, such as  heavy cream, butter, frankfurters, sausage, and other fatty meats.  Avoid foods that make you go to the bathroom, such as fruit, fruit juice, and dairy products.  Cut out carbonated drinks, chewing gum, and "gassy" foods such as beans and cabbage. This may help relieve bloating and burping.  Eat foods with bran, and drink plenty of liquids with the bran foods. This helps relieve constipation.  Keep track of what foods seem to bring on your symptoms.  Avoid emotionally charged situations or circumstances that produce anxiety.  Start or continue exercising.  Get plenty of rest and sleep. Document Released: 12/16/2005 Document Revised: 12/21/2013 Document Reviewed: 08/05/2008 [facility] Patient Information 2015 [place], [place]. This information is not intended to replace advice given to you by your health care provider. Make sure you discuss any questions you have with your health care provider.

## 2014-12-16 NOTE — ED Provider Notes (Addendum)
CSN: [id number]     Arrival date & time 12/16/14  1353 History   First MD Initiated Contact with Patient 12/16/14 1523     Chief Complaint  Patient presents with  . Abdominal Pain  . Neck Pain     (Consider location/radiation/quality/duration/timing/severity/associated sxs/prior Treatment) Patient is a 23 y.o. male presenting with abdominal pain and neck pain. The history is provided by the patient.  Abdominal Pain Associated symptoms: diarrhea   Associated symptoms: no chest pain, no chills, no cough, no dysuria, no fever, no shortness of breath, no sore throat and no vomiting   Neck Pain Associated symptoms: no chest pain, no fever and no headaches   pt c/o intermittent abd cramping, several loose bms per day, small amount bright red blood per rectum, for the past several years. Symptoms episodic, persistent, moderate. States every day for years. No acute or abrupt change today or this week. No recent wt loss.  No fever or chills. Normal appetite. No vomiting. No prior abd surgery. Reports prior neg eval by gi in past w egd and colonoscopy - reportedly normal. No hx pud or gallstones.  Pt states w above symptoms, no specific exacerbating or alleviating factors. States not related to certain foods or types of foods.         Past Medical History  Diagnosis Date  . Scoliosis   . IBS (irritable bowel syndrome)   . Depression   . Anxiety   . Allergy    Past Surgical History  Procedure Laterality Date  . Multiple leg surgeries      d/t staph infection   Family History  Problem Relation Age of Onset  . Adopted: Yes   History  Substance Use Topics  . Smoking status: Current Every Day Smoker -- 0.50 packs/day for 1 years    Types: Cigarettes  . Smokeless tobacco: Never Used  . Alcohol Use: Yes     Comment: occassionaly    Review of Systems  Constitutional: Negative for fever and chills.  HENT: Negative for sore throat.   Eyes: Negative for redness.  Respiratory:  Negative for cough and shortness of breath.   Cardiovascular: Negative for chest pain.  Gastrointestinal: Positive for abdominal pain and diarrhea. Negative for vomiting.  Genitourinary: Negative for dysuria and flank pain.  Musculoskeletal: Positive for neck pain. Negative for back pain.  Skin: Negative for rash.  Neurological: Negative for headaches.  Hematological: Does not bruise/bleed easily.  Psychiatric/Behavioral: Negative for confusion.      Allergies  Cashew nut oil; Lactose intolerance (gi); Nsaids; and Sulfa antibiotics  Home Medications   Prior to Admission medications   Medication Sig Start Date End Date Taking? Authorizing Provider  acetaminophen (TYLENOL) 325 MG tablet Take 650 mg by mouth every 6 (six) hours as needed for mild pain or headache.   Yes Historical Provider, MD  metaxalone (SKELAXIN) 800 MG tablet Take 0.5-1 tablets (400-800 mg total) by mouth 3 (three) times daily. Patient not taking: Reported on 12/16/2014 10/10/14   Dolores Lory, PA-C   BP 121/66 mmHg  Pulse 88  Temp(Src) 97.5 F (36.4 C) (Oral)  Resp 18  SpO2 100% Physical Exam  Constitutional: He is oriented to person, place, and time. He appears well-developed and well-nourished. No distress.  HENT:  Mouth/Throat: Oropharynx is clear and moist.  Eyes: Conjunctivae are normal. No scleral icterus.  Neck: Neck supple. No tracheal deviation present.  Cardiovascular: Normal rate, regular rhythm, normal heart sounds and intact distal pulses.  Exam  reveals no gallop and no friction rub.   No murmur heard. Pulmonary/Chest: Effort normal and breath sounds normal. No accessory muscle usage. No respiratory distress.  Abdominal: Soft. Bowel sounds are normal. He exhibits no distension. There is no tenderness.  Genitourinary:  No cva tenderness No formed stool on rectal, scant amount yellowish stool on glove, grossly negative but tests heme positive.  No hemorrhoids visualized. No anal fissure  seen. No mass felt.   Musculoskeletal: Normal range of motion. He exhibits no edema or tenderness.  Neurological: He is alert and oriented to person, place, and time.  Skin: Skin is warm and dry. No rash noted.  Psychiatric: He has a normal mood and affect.  Nursing note and vitals reviewed.   ED Course  Procedures (including critical care time) Labs Review Results for orders placed or performed during the hospital encounter of 12/16/14  CBC with Differential  Result Value Ref Range   WBC 5.1 4.0 - 10.5 K/uL   RBC 5.13 4.22 - 5.81 MIL/uL   Hemoglobin 16.8 13.0 - 17.0 g/dL   HCT 16.146.5 09.639.0 - 04.552.0 %   MCV 90.6 78.0 - 100.0 fL   MCH 32.7 26.0 - 34.0 pg   MCHC 36.1 (H) 30.0 - 36.0 g/dL   RDW 40.912.6 81.111.5 - 91.415.5 %   Platelets 175 150 - 400 K/uL   Neutrophils Relative % 47 43 - 77 %   Neutro Abs 2.4 1.7 - 7.7 K/uL   Lymphocytes Relative 38 12 - 46 %   Lymphs Abs 1.9 0.7 - 4.0 K/uL   Monocytes Relative 8 3 - 12 %   Monocytes Absolute 0.4 0.1 - 1.0 K/uL   Eosinophils Relative 6 (H) 0 - 5 %   Eosinophils Absolute 0.3 0.0 - 0.7 K/uL   Basophils Relative 1 0 - 1 %   Basophils Absolute 0.0 0.0 - 0.1 K/uL  Comprehensive metabolic panel  Result Value Ref Range   Sodium 141 137 - 147 mEq/L   Potassium 4.8 3.7 - 5.3 mEq/L   Chloride 102 96 - 112 mEq/L   CO2 28 19 - 32 mEq/L   Glucose, Bld 94 70 - 99 mg/dL   BUN 9 6 - 23 mg/dL   Creatinine, Ser 7.821.07 0.50 - 1.35 mg/dL   Calcium 9.7 8.4 - 95.610.5 mg/dL   Total Protein 7.5 6.0 - 8.3 g/dL   Albumin 4.5 3.5 - 5.2 g/dL   AST 16 0 - 37 U/L   ALT 10 0 - 53 U/L   Alkaline Phosphatase 70 39 - 117 U/L   Total Bilirubin 0.4 0.3 - 1.2 mg/dL   GFR calc non Af Amer >90 >90 mL/min   GFR calc Af Amer >90 >90 mL/min   Anion gap 11 5 - 15  POC occult blood, ED Provider will collect  Result Value Ref Range   Fecal Occult Bld POSITIVE (A) NEGATIVE      MDM   Labs.  Reviewed nursing notes and prior charts for additional history.   Pt states for  several years, notes small amt bright red blood in stool, only w bms. Reports pcp and gi eval in past, w egd and colonoscopy 2-3 yrs ago in [place] - per his report, normal. From pcp note ?hx irritable bowel. No hx inflammatory bowel disease.    Given approx 3 years since prior gi eval for same, discussed referral to gi for close follow up and further evaluation symptoms.   Vitals normal, afeb, abd soft  nt, hgb 16.  Pt appears stable for d/c.       Suzi RootsKevin E Rilynne Lonsway, MD 12/16/14 [id number]

## 2014-12-20 ENCOUNTER — Ambulatory Visit (INDEPENDENT_AMBULATORY_CARE_PROVIDER_SITE_OTHER): Payer: Managed Care, Other (non HMO) | Admitting: Internal Medicine

## 2014-12-20 ENCOUNTER — Other Ambulatory Visit (INDEPENDENT_AMBULATORY_CARE_PROVIDER_SITE_OTHER): Payer: Managed Care, Other (non HMO)

## 2014-12-20 ENCOUNTER — Encounter: Payer: Self-pay | Admitting: Internal Medicine

## 2014-12-20 VITALS — BP 110/60 | HR 67 | Ht 67.0 in | Wt 134.8 lb

## 2014-12-20 DIAGNOSIS — R634 Abnormal weight loss: Secondary | ICD-10-CM

## 2014-12-20 DIAGNOSIS — R109 Unspecified abdominal pain: Secondary | ICD-10-CM

## 2014-12-20 DIAGNOSIS — K921 Melena: Secondary | ICD-10-CM

## 2014-12-20 DIAGNOSIS — R1030 Lower abdominal pain, unspecified: Secondary | ICD-10-CM

## 2014-12-20 LAB — TSH: TSH: 2.23 u[IU]/mL (ref 0.35–4.50)

## 2014-12-20 LAB — HIGH SENSITIVITY CRP

## 2014-12-20 LAB — IGA: IgA: 313 mg/dL (ref 68–378)

## 2014-12-20 MED ORDER — PEG-KCL-NACL-NASULF-NA ASC-C 100 G PO SOLR: 1.0000 | Freq: Once | ORAL | Status: DC

## 2014-12-20 MED ORDER — TRAMADOL HCL 50 MG PO TABS: 50.0000 mg | ORAL_TABLET | Freq: Four times a day (QID) | ORAL | Status: DC | PRN

## 2014-12-20 MED ORDER — GLYCOPYRROLATE 2 MG PO TABS: 2.0000 mg | ORAL_TABLET | Freq: Three times a day (TID) | ORAL | Status: DC

## 2014-12-20 NOTE — Patient Instructions (Signed)
Go to the basement for labs today We have sent your prescriptions to your pharmacy  You have been scheduled for a colonoscopy. Please follow written instructions given to you at your visit today.  Please pick up your prep kit at the pharmacy within the next 1-3 days. If you use inhalers (even only as needed), please bring them with you on the day of your procedure. Your physician has requested that you go to www.startemmi.com and enter the access code given to you at your visit today. This web site gives a general overview about your procedure. However, you should still follow specific instructions given to you by our office regarding your preparation for the procedure.

## 2014-12-20 NOTE — Progress Notes (Signed)
Patient ID: Manuel Dean, male   DOB: Feb 18, 1991, 23 y.o.   MRN: [id number] HPI: Manuel Dean is a 23 yo male with PMH of anxiety and depression, IBD vs. IBS who is seen to evaluate lower abdominal pain and heme positive stool. He is here today with his wife. He reports 3-4 years of of lower abdominal pain. This varies but is present most days. At its worst it can be 7-8 out of 10 and at best 4 out of 10. It often prevents working in other activities he desires to do. He reports the pain is worse after eating and can be but is not always relieved with defecation. He has associated nausea with rare vomiting. His weight has fluctuated but is down about 10 pounds he reports normal weight of 145. Bowel movements vary can be 10-15 times per day but then he can have days where he does not have bowel movements. He very rarely sees bright red blood in his stool. Recently on evaluation in the ER his stool was noted to be heme positive. No family history as he is adopted.  3 [facility] 4 [facility] [place] [facility] [place] [facility] he believes is [facility]. He reports he had an upper endoscopy and colonoscopy. Colonoscopy reportedly revealed "inflammation" but there was question as to whether this was acute or chronic. It seems he was tried on sulfasalazine but was allergic. He also tried multiple anti-spasmodics including Levsin and Bentyl. Levsin helped but caused some blurry vision. He is currently taking no medications other than Tylenol.   Past Medical History  Diagnosis Date  . Scoliosis   . IBS (irritable bowel syndrome)   . Depression   . Anxiety   . Allergy     Past Surgical History  Procedure Laterality Date  . Multiple leg surgeries      d/t staph infection    Outpatient Prescriptions Prior to Visit  Medication Sig Dispense Refill  . acetaminophen (TYLENOL) 325 MG tablet Take 650 mg by mouth every 6 (six) hours as needed for mild pain or headache.    .  metaxalone (SKELAXIN) 800 MG tablet Take 0.5-1 tablets (400-800 mg total) by mouth 3 (three) times daily. (Patient not taking: Reported on 12/16/2014) 30 tablet 0   No facility-administered medications prior to visit.    Allergies  Allergen Reactions  . Cashew Nut Oil Anaphylaxis    All tree nuts  . Lactose Intolerance (Gi) Nausea And Vomiting  . Nsaids Other (See Comments)    Internal bleeding  . Sulfa Antibiotics     Family History  Problem Relation Age of Onset  . Adopted: Yes    History  Substance Use Topics  . Smoking status: Current Every Day Smoker -- 0.50 packs/day for 1 years    Types: Cigarettes  . Smokeless tobacco: Never Used  . Alcohol Use: No     Comment: occassionaly    ROS: As per history of present illness, otherwise negative  BP 110/60 mmHg  Pulse 67  Ht 5\' 7"  (1.702 m)  Wt 134 lb 12.8 oz (61.145 kg)  BMI 21.11 kg/m2  SpO2 98% Constitutional: Well-developed and well-nourished. No distress. HEENT: Normocephalic and atraumatic. Oropharynx is clear and moist. No oropharyngeal exudate. Conjunctivae are normal.  No scleral icterus. Neck: Neck supple. Trachea midline. Cardiovascular: Normal rate, regular rhythm and intact distal pulses. No M/R/G Pulmonary/chest: Effort normal and breath sounds normal. No wheezing, rales or rhonchi. Abdominal: Soft, diffuse abdominal tenderness  without rebound or guarding, nondistended. Bowel sounds active throughout. There are no masses palpable. No hepatosplenomegaly. Extremities: no clubbing, cyanosis, or edema Lymphadenopathy: No cervical adenopathy noted. Neurological: Alert and oriented to person place and time. Skin: Skin is warm and dry. No rashes noted. Psychiatric: Normal mood and affect. Behavior is normal.  RELEVANT LABS AND IMAGING: CBC    Component Value Date/Time   WBC 5.1 12/16/2014 1405   WBC 5.2 08/25/2014 1450   RBC 5.13 12/16/2014 1405   RBC 5.27 08/25/2014 1450   HGB 16.8 12/16/2014 1405   HGB  16.1 08/25/2014 1450   HCT 46.5 12/16/2014 1405   HCT 49.9 08/25/2014 1450   PLT 175 12/16/2014 1405   MCV 90.6 12/16/2014 1405   MCV 94.8 08/25/2014 1450   MCH 32.7 12/16/2014 1405   MCH 30.5 08/25/2014 1450   MCHC 36.1* 12/16/2014 1405   MCHC 32.2 08/25/2014 1450   RDW 12.6 12/16/2014 1405   LYMPHSABS 1.9 12/16/2014 1405   MONOABS 0.4 12/16/2014 1405   EOSABS 0.3 12/16/2014 1405   BASOSABS 0.0 12/16/2014 1405    CMP     Component Value Date/Time   NA 141 12/16/2014 1405   K 4.8 12/16/2014 1405   CL 102 12/16/2014 1405   CO2 28 12/16/2014 1405   GLUCOSE 94 12/16/2014 1405   BUN 9 12/16/2014 1405   CREATININE 1.07 12/16/2014 1405   CREATININE 1.05 08/25/2014 1444   CALCIUM 9.7 12/16/2014 1405   PROT 7.5 12/16/2014 1405   ALBUMIN 4.5 12/16/2014 1405   AST 16 12/16/2014 1405   ALT 10 12/16/2014 1405   ALKPHOS 70 12/16/2014 1405   BILITOT 0.4 12/16/2014 1405   GFRNONAA >90 12/16/2014 1405   GFRAA >90 12/16/2014 1405     ASSESSMENT/PLAN:  23 yo male with PMH of anxiety and depression, IBD vs. IBS who is seen to evaluate lower abdominal pain and heme positive stool.  1. Heme + stools/lower abd pain/?IBD/weight loss -- given that he was previously started on sulfasalazine this raises the question of IBD. We will obtain records from previous evaluation at GI several years ago. He is still having some rectal bleeding, recent heme positive stool and considerable lower abdominal pain. For this reason I recommended repeat upper endoscopy and colonoscopy. If negative he might need capsule endoscopy. Robinul Forte 3 times a day when necessary for lower abdominal cramping pain. Tramadol for more severe pain on a very sparing basis, 50 mg every 6 hours. TSH and celiac pounds today. High sensitivity CRP today. Procedures were discussed in detail including the risks and benefits and he is agreeable to proceed. Endoscopies to rule out IBD

## 2014-12-22 LAB — TISSUE TRANSGLUTAMINASE, IGA: Tissue Transglutaminase Ab, IgA: 1 U/mL (ref <4)

## 2014-12-23 ENCOUNTER — Telehealth: Payer: Self-pay | Admitting: Gastroenterology

## 2014-12-23 NOTE — Telephone Encounter (Signed)
Having persistent abdominal pain.  Sees some blood in his stool.  Told to go to ED if symptoms are worsening.

## 2014-12-27 ENCOUNTER — Telehealth: Payer: Self-pay | Admitting: Internal Medicine

## 2014-12-27 NOTE — Telephone Encounter (Signed)
Patient states he forgot to tell Dr. Rhea BeltonPyrtle about stool when he is constipated is usually covered with mucous. Does not happen often. He also reports when he took Tramadol, he had nausea and itching. It helped his pain but the side effects were bothersome.

## 2015-01-02 NOTE — Telephone Encounter (Signed)
Pt states he cannot take Ultram due to nausea, itching, and leg cramping. Dr. Rhea Belton aware, see result note.

## 2015-01-02 NOTE — Telephone Encounter (Signed)
Patient has EGD and colon scheduled later this month Can try tramadol again and notify us if still bothersome nausea

## 2015-01-04 NOTE — Progress Notes (Signed)
Pt states the robinul was helping a little but he went back to dicyclomine and it is helping better. He does state that it makes his vision blurry but it is ok because he is not working.

## 2015-01-04 NOTE — Progress Notes (Signed)
Patient ID: Manuel Dean, male   DOB: 1991/04/19, 24 y.o.   MRN: [id number] For abd pain, is robinul forte helping?  I understand he is having intolerance to tramadol.  Would like to avoid narcotics which can interfere with gut function as well

## 2015-01-24 ENCOUNTER — Encounter: Payer: Self-pay | Admitting: Internal Medicine

## 2015-01-24 ENCOUNTER — Ambulatory Visit (AMBULATORY_SURGERY_CENTER): Payer: Managed Care, Other (non HMO) | Admitting: Internal Medicine

## 2015-01-24 VITALS — BP 94/50 | HR 66 | Temp 99.1°F | Resp 20 | Ht 67.0 in | Wt 134.0 lb

## 2015-01-24 DIAGNOSIS — K2950 Unspecified chronic gastritis without bleeding: Secondary | ICD-10-CM

## 2015-01-24 DIAGNOSIS — K921 Melena: Secondary | ICD-10-CM

## 2015-01-24 DIAGNOSIS — R634 Abnormal weight loss: Secondary | ICD-10-CM

## 2015-01-24 DIAGNOSIS — K589 Irritable bowel syndrome without diarrhea: Secondary | ICD-10-CM

## 2015-01-24 DIAGNOSIS — R1030 Lower abdominal pain, unspecified: Secondary | ICD-10-CM

## 2015-01-24 MED ORDER — SODIUM CHLORIDE 0.9 % IV SOLN: 500.0000 mL | INTRAVENOUS | Status: DC

## 2015-01-24 MED ORDER — RIFAXIMIN 550 MG PO TABS: 550.0000 mg | ORAL_TABLET | Freq: Three times a day (TID) | ORAL | Status: DC

## 2015-01-24 NOTE — Op Note (Signed)
[facility] [address] [place], [id number]   COLONOSCOPY PROCEDURE REPORT  PATIENT: Manuel Dean, Manuel Dean  MR#: [id number] BIRTHDATE: 1991/08/17 , 23  yrs. old GENDER: male ENDOSCOPIST: Beverley FiedlerJay M Sierra Bissonette, MD PROCEDURE DATE:  01/24/2015 PROCEDURE:   Colonoscopy, diagnostic First Screening Colonoscopy - Avg.  risk and is 50 yrs.  old or older - No.  Prior Negative Screening - Now for repeat screening. N/A  History of Adenoma - Now for follow-up colonoscopy & has been > or = to 3 yrs.  N/A  Polyps Removed Today? No.  Polyps Removed Today? No.  Recommend repeat exam, <10 yrs? Polyps Removed Today? No.  Recommend repeat exam, <10 yrs? No. ASA CLASS:   Class II INDICATIONS:heme-positive stool and lower abdominal pain, weight loss. MEDICATIONS: Monitored anesthesia care, Propofol 100 mg IV, and Residual sedation present  DESCRIPTION OF PROCEDURE:   After the risks benefits and alternatives of the procedure were thoroughly explained, informed consent was obtained.  The digital rectal exam revealed no abnormalities of the rectum.   The LB [id number]  endoscope was introduced through the anus and advanced to the cecum, which was identified by both the appendix and ileocecal valve. No adverse events experienced.   The quality of the prep was good, using MoviPrep  The instrument was then slowly withdrawn as the colon was fully examined.    COLON FINDINGS: Normal examined terminal ileum.  A normal appearing cecum, ileocecal valve, and appendiceal orifice were identified. the ascending, transverse, descending, sigmoid colon, and rectum appeared unremarkable.  Retroflexed views revealed small internal hemorrhoids. The time to cecum=1 minutes 26 seconds.  Withdrawal time=6 minutes 09 seconds.  The scope was withdrawn and the procedure completed.  COMPLICATIONS: There were no complications.  ENDOSCOPIC IMPRESSION: Normal terminal ileum in the examined segment and  normal colonoscopy.  Small internal hemorrhoids.  RECOMMENDATIONS: 1.  Trial of rifaximin 550 mg three times daily for 14 days for IBS-D. 2.  If symptoms persist beyond this treatment, would recommend cross-sectional imaging  eSigned:  Beverley FiedlerJay M Phineas Mcenroe, MD 01/24/2015 3:58 PM   cc: The Patient, PCP

## 2015-01-24 NOTE — Op Note (Signed)
[facility] [address] [place], [id number]   ENDOSCOPY PROCEDURE REPORT  PATIENT: Manuel Dean, Manuel  MR#: [id number] BIRTHDATE: 05-21-1991 , 23  yrs. old GENDER: male ENDOSCOPIST: Beverley FiedlerJay M Mariel Lukins, MD PROCEDURE DATE:  01/24/2015 PROCEDURE:  EGD w/ biopsy ASA CLASS:     Class II INDICATIONS:  abdominal pain, heme positive stool, and weight loss.  MEDICATIONS: Monitored anesthesia care and Propofol 400 mg IV TOPICAL ANESTHETIC: none  DESCRIPTION OF PROCEDURE: After the risks benefits and alternatives of the procedure were thoroughly explained, informed consent was obtained.  The LB [id number] endoscope was introduced through the mouth and advanced to the second portion of the duodenum , Without limitations.  The instrument was slowly withdrawn as the mucosa was fully examined.  ESOPHAGUS: The mucosa of the esophagus appeared normal.  STOMACH: Gastropathy was found in the gastric antrum.  Cold forcep biopsies were taken at the gastric body, antrum and angularis to evaluate for h.  pylori.  DUODENUM: The duodenal mucosa showed no abnormalities in the bulb and 2nd part of the duodenum.  Cold forceps biopsies were taken in the bulb and second portion.  Retroflexed views revealed no abnormalities.     The scope was then withdrawn from the patient and the procedure completed.  COMPLICATIONS: There were no immediate complications.  ENDOSCOPIC IMPRESSION: 1.   The mucosa of the esophagus appeared normal 2.   Mild gastropathy was found in the gastric antrum 3.   The duodenal mucosa showed no abnormalities in the bulb and 2nd part of the duodenum cold forceps biopsies were taken in the bulb and second portion  RECOMMENDATIONS: 1.  Await biopsy results 2.  Proceed with a Colonoscopy.  eSigned:  Beverley FiedlerJay M Janaiah Vetrano, MD 01/24/2015 3:53 PM    CC:The Patient, PCP

## 2015-01-24 NOTE — Progress Notes (Signed)
Report to PACU, RN, vss, BBS= Clear.  

## 2015-01-24 NOTE — Progress Notes (Signed)
Called to room to assist during endoscopic procedure.  Patient ID and intended procedure confirmed with present staff. Received instructions for my participation in the procedure from the performing physician.  

## 2015-01-24 NOTE — Patient Instructions (Signed)

## 2015-01-25 ENCOUNTER — Telehealth: Payer: Self-pay | Admitting: *Deleted

## 2015-01-25 NOTE — Telephone Encounter (Signed)
  Follow up Call-  Call back number 01/24/2015  Post procedure Call Back phone  # [phone]  Permission to leave phone message Yes     Patient questions:  Do you have a fever, pain , or abdominal swelling? No. Pain Score  0 *  Have you tolerated food without any problems? Yes.    Have you been able to return to your normal activities? Yes.    Do you have any questions about your discharge instructions: Diet   No. Medications  No. Follow up visit  No.  Do you have questions or concerns about your Care? No.  Actions: * If pain score is 4 or above: No action needed, pain <4.

## 2015-01-31 ENCOUNTER — Encounter: Payer: Self-pay | Admitting: *Deleted

## 2015-01-31 ENCOUNTER — Encounter: Payer: Self-pay | Admitting: Internal Medicine

## 2015-01-31 NOTE — Telephone Encounter (Signed)
Erroneous encounter

## 2015-03-28 ENCOUNTER — Encounter: Payer: Self-pay | Admitting: *Deleted

## 2015-04-06 ENCOUNTER — Encounter: Payer: Self-pay | Admitting: Internal Medicine

## 2015-04-06 ENCOUNTER — Ambulatory Visit (INDEPENDENT_AMBULATORY_CARE_PROVIDER_SITE_OTHER): Payer: Managed Care, Other (non HMO) | Admitting: Internal Medicine

## 2015-04-06 VITALS — BP 122/60 | HR 76 | Ht 67.0 in | Wt 128.2 lb

## 2015-04-06 DIAGNOSIS — R194 Change in bowel habit: Secondary | ICD-10-CM

## 2015-04-06 DIAGNOSIS — R109 Unspecified abdominal pain: Secondary | ICD-10-CM

## 2015-04-06 DIAGNOSIS — R110 Nausea: Secondary | ICD-10-CM | POA: Diagnosis not present

## 2015-04-06 DIAGNOSIS — R634 Abnormal weight loss: Secondary | ICD-10-CM

## 2015-04-06 MED ORDER — PRAMOXINE HCL 1 % RE FOAM: 1.0000 "application " | Freq: Three times a day (TID) | RECTAL | Status: DC

## 2015-04-06 MED ORDER — GLYCOPYRROLATE 2 MG PO TABS: 2.0000 mg | ORAL_TABLET | Freq: Three times a day (TID) | ORAL | Status: AC

## 2015-04-06 MED ORDER — ONDANSETRON 4 MG PO TBDP: ORAL_TABLET | ORAL | Status: AC

## 2015-04-06 NOTE — Progress Notes (Signed)
Subjective:    Patient ID: Manuel Dean, male    DOB: 10/09/1991, 24 y.o.   MRN: [id number]  HPI Manuel Dean is a 24 yo male with PMH of IBS, anxiety depression who seen in follow-up. He was initially seen on 12/20/2014 and had an upper endoscopy and colonoscopy performed on 01/24/2015. Upper endoscopy showed mild antral gastropathy but was otherwise unremarkable. Gastric biopsies showed mild chronic inflammation negative for H. pylori, metaplasia and dysplasia. Small bowel biopsies were normal. Colonoscopy to the terminal ileum was normal. Small internal hemorrhoids were seen. He was treated with rifaximin 550 mg 3 times daily for 14 days for IBS D. He reports that that medication did not seem to provide much benefit. He continues to have issues with abdominal pain. Bowel habits have been more erratic with both constipation and at times loose stools. He tried Bentyl without much relief and has used glycopyrrolate which seems to help when the pain is not as severe. He has diffuse abdominal discomfort at times lower abdominal cramping which seems to be relieved with defecation. Appetite has been overall poor and eating seems to make his symptoms worse. He is down an additional 6 pounds. He's having daily nausea and vomiting about 1-2 days per week. He denies dysphagia. Denies recent blood in his stool or melena. He does admit to marijuana use which occasionally helps with nausea and appetite but he is not using this frequently.  Review of Systems As per history of present illness, otherwise negative  Current Medications, Allergies, Past Medical History, Past Surgical History, Family History and Social History were reviewed in [facility] electronic medical record.     Objective:   Physical Exam BP 122/60 mmHg  Pulse 76  Ht 5\' 7"  (1.702 m)  Wt 128 lb 4 oz (58.174 kg)  BMI 20.08 kg/m2 Constitutional: Well-developed and well-nourished. No distress. HEENT: Normocephalic and atraumatic.  Oropharynx is clear and moist. No oropharyngeal exudate. Conjunctivae are normal.  No scleral icterus. Neck: Neck supple. Trachea midline. Cardiovascular: Normal rate, regular rhythm and intact distal pulses. No M/R/G Pulmonary/chest: Effort normal and breath sounds normal. No wheezing, rales or rhonchi. Abdominal: Soft, nontender, nondistended. Bowel sounds active throughout. There are no masses palpable. No hepatosplenomegaly. Extremities: no clubbing, cyanosis, or edema Lymphadenopathy: No cervical adenopathy noted. Neurological: Alert and oriented to person place and time. Skin: Skin is warm and dry. No rashes noted. Psychiatric: Normal mood and affect. Behavior is normal.  EGD/colon reviewed including with the patient  CBC    Component Value Date/Time   WBC 5.1 12/16/2014 1405   WBC 5.2 08/25/2014 1450   RBC 5.13 12/16/2014 1405   RBC 5.27 08/25/2014 1450   HGB 16.8 12/16/2014 1405   HGB 16.1 08/25/2014 1450   HCT 46.5 12/16/2014 1405   HCT 49.9 08/25/2014 1450   PLT 175 12/16/2014 1405   MCV 90.6 12/16/2014 1405   MCV 94.8 08/25/2014 1450   MCH 32.7 12/16/2014 1405   MCH 30.5 08/25/2014 1450   MCHC 36.1* 12/16/2014 1405   MCHC 32.2 08/25/2014 1450   RDW 12.6 12/16/2014 1405   LYMPHSABS 1.9 12/16/2014 1405   MONOABS 0.4 12/16/2014 1405   EOSABS 0.3 12/16/2014 1405   BASOSABS 0.0 12/16/2014 1405    CMP     Component Value Date/Time   NA 141 12/16/2014 1405   K 4.8 12/16/2014 1405   CL 102 12/16/2014 1405   CO2 28 12/16/2014 1405   GLUCOSE 94 12/16/2014 1405   BUN 9  12/16/2014 1405   CREATININE 1.07 12/16/2014 1405   CREATININE 1.05 08/25/2014 1444   CALCIUM 9.7 12/16/2014 1405   PROT 7.5 12/16/2014 1405   ALBUMIN 4.5 12/16/2014 1405   AST 16 12/16/2014 1405   ALT 10 12/16/2014 1405   ALKPHOS 70 12/16/2014 1405   BILITOT 0.4 12/16/2014 1405   GFRNONAA >90 12/16/2014 1405   GFRAA >90 12/16/2014 1405   Celiac panel, TSH, hsCRP all normal     Assessment &  Plan:   24 yo male with PMH of IBS, anxiety depression who seen in follow-up.  1. Abd pain (diffuse)/alternating loose stools and constipation/poor appetite/weight loss -- unclear cause for constellation of symptoms. Query severe IBS. I recommended cross-sectional imaging with CT scan of the abdomen and pelvis with contrast to better evaluate abdominal pain and weight loss. If negative would proceed to gastric emptying scan the rule out gastroparesis. I've asked that he take like appear late 3 times daily to try to prevent exacerbations of abdominal pain. Zofran ODT 4-8 mg every 8 hours as needed for nausea. We discussed pain control and I would like to avoid narcotics given that they can worsen constipation, do not often help with GI related pain, potential for abuse, etc. Of note there was no evidence for inflammatory bowel disease seen at upper or lower endoscopy.  2. Internal hemorrhoids -- he requests Proctofoam for. No irritation. Prescription provided.  Return in 8-12 weeks, sooner if necessary

## 2015-04-06 NOTE — Patient Instructions (Addendum)
We have sent the following medications to your pharmacy for you to pick up at your convenience: Robinul Zofran Proctofoam  You have been scheduled for a gastric emptying scan at [facility] on 04/21/15 at 9:30 am. Please arrive at least 15 minutes prior to your appointment for registration. Please make certain not to have anything to eat or drink after midnight the night before your test. Hold all stomach medications (ex: Zofran, phenergan, Reglan) 48 hours prior to your test. If you need to reschedule your appointment, please contact radiology scheduling at [phone]. _____________________________________________________________________ A gastric-emptying study measures how long it takes for food to move through your stomach. There are several ways to measure stomach emptying. In the most common test, you eat food that contains a small amount of radioactive material. A scanner that detects the movement of the radioactive material is placed over your abdomen to monitor the rate at which food leaves your stomach. This test normally takes about 2 hours to complete. _____________________________________________________________________  Manuel Dean have been scheduled for a CT scan of the abdomen and pelvis at [facility] ([address]---this is in the same building as Press [facility]).   You are scheduled on Friday 04/07/15 at 3:00 pm. You should arrive 15 minutes prior to your appointment time for registration. Please follow the written instructions below on the day of your exam:  WARNING: IF YOU ARE ALLERGIC TO IODINE/X-RAY DYE, PLEASE NOTIFY RADIOLOGY IMMEDIATELY AT [phone]! YOU WILL BE GIVEN A 13 HOUR PREMEDICATION PREP.  1) Do not eat or drink anything after 11:00 am (4 hours prior to your test) 2) You have been given 2 bottles of oral contrast to drink. The solution may taste better if refrigerated, but do NOT add ice or any other liquid to this solution. Shake well  before drinking.    Drink 1 bottle of contrast @ 1:00 pm (2 hours prior to your exam)  Drink 1 bottle of contrast @ 2:00 pm (1 hour prior to your exam)  You may take any medications as prescribed with a small amount of water except for the following: Metformin, Glucophage, Glucovance, Avandamet, Riomet, Fortamet, Actoplus Met, Janumet, Glumetza or Metaglip. The above medications must be held the day of the exam AND 48 hours after the exam.  The purpose of you drinking the oral contrast is to aid in the visualization of your intestinal tract. The contrast solution may cause some diarrhea. Before your exam is started, you will be given a small amount of fluid to drink. Depending on your individual set of symptoms, you may also receive an intravenous injection of x-ray contrast/dye. Plan on being at [facility] for 30 minutes or long, depending on the type of exam you are having performed.  This test typically takes 30-45 minutes to complete.  If you have any questions regarding your exam or if you need to reschedule, you may call the CT department at [phone] between the hours of 8:00 am and 5:00 pm, Monday-Friday.  ________________________________________________________________________

## 2015-04-07 ENCOUNTER — Ambulatory Visit (INDEPENDENT_AMBULATORY_CARE_PROVIDER_SITE_OTHER)
Admission: RE | Admit: 2015-04-07 | Discharge: 2015-04-07 | Disposition: A | Discharge status: Home / Self Care | Payer: Managed Care, Other (non HMO) | Source: Ambulatory Visit | Attending: Internal Medicine | Admitting: Internal Medicine

## 2015-04-07 DIAGNOSIS — R194 Change in bowel habit: Secondary | ICD-10-CM

## 2015-04-07 DIAGNOSIS — R110 Nausea: Secondary | ICD-10-CM

## 2015-04-07 DIAGNOSIS — R109 Unspecified abdominal pain: Secondary | ICD-10-CM

## 2015-04-07 DIAGNOSIS — R634 Abnormal weight loss: Secondary | ICD-10-CM

## 2015-04-07 MED ORDER — IOHEXOL 300 MG/ML  SOLN
100.0000 mL | Freq: Once | INTRAMUSCULAR | Status: AC | PRN
  Administered 2015-04-07: 100 mL via INTRAVENOUS

## 2015-04-21 ENCOUNTER — Ambulatory Visit (HOSPITAL_COMMUNITY)
Admission: RE | Admit: 2015-04-21 | Discharge: 2015-04-21 | Disposition: A | Discharge status: Home / Self Care | Payer: Managed Care, Other (non HMO) | Source: Ambulatory Visit | Attending: Internal Medicine | Admitting: Internal Medicine

## 2015-04-21 DIAGNOSIS — R112 Nausea with vomiting, unspecified: Secondary | ICD-10-CM | POA: Insufficient documentation

## 2015-04-21 DIAGNOSIS — R109 Unspecified abdominal pain: Secondary | ICD-10-CM | POA: Insufficient documentation

## 2015-04-21 DIAGNOSIS — R140 Abdominal distension (gaseous): Secondary | ICD-10-CM | POA: Diagnosis not present

## 2015-04-21 DIAGNOSIS — R110 Nausea: Secondary | ICD-10-CM

## 2015-04-21 DIAGNOSIS — R634 Abnormal weight loss: Secondary | ICD-10-CM | POA: Diagnosis not present

## 2015-04-21 DIAGNOSIS — R194 Change in bowel habit: Secondary | ICD-10-CM

## 2015-04-21 MED ORDER — TECHNETIUM TC 99M SULFUR COLLOID
2.1000 | Freq: Once | INTRAVENOUS | Status: AC | PRN
  Administered 2015-04-21: 2.1 via ORAL

## 2015-04-26 ENCOUNTER — Telehealth: Payer: Self-pay | Admitting: Internal Medicine

## 2015-04-26 DIAGNOSIS — Z0283 Encounter for blood-alcohol and blood-drug test: Secondary | ICD-10-CM

## 2015-04-26 NOTE — Telephone Encounter (Signed)
Marinol may be an option but he will need to have negative drug screens first and during treatment If he is agreeable, then can have him come for Urine Drug Screen

## 2015-04-26 NOTE — Telephone Encounter (Signed)
Patient states that proctofoam is not carried by his pharmacy and he would like something similar called in to pharmacy. Patient also requests medication for pain and an appetite "stimulant." I advised that we do not give narcotic pain medications (as discussed in previous office note) due to constipation and other negative GI side effects. Patient states that he was told that he may be able to use marinol as an appetite stimulant (of note, patient's drug hx shows +marijuana use) and states that he was also told this would be discussed as an option after his gastric emptying scan. Says he was also told about another appetite stimulant but was told it causes blood clots. He is scheduled for an office visit for follow up on 06/21/15 and tells me he needs to be able to get something for the pain and for his appetite so he can get back to work and pay his bills.

## 2015-04-27 NOTE — Telephone Encounter (Signed)
Spoke with Dr. Rhea BeltonPyrtle and he states that if the pt is willing to come and have a urine drug screen now then we can go ahead and get this started.

## 2015-04-27 NOTE — Telephone Encounter (Signed)
Is this something that you guys will discuss at office visit or do I need to go ahead and get this started prior to visit?

## 2015-04-27 NOTE — Telephone Encounter (Signed)
[facility]. Orders entered in [facility]. I put in order for blood drug screen because other physicians within our practice have had inaccurate results from the urine drug screens.Marland Kitchen.Marland Kitchen.Marland Kitchen.Dr Rhea BeltonPyrtle- patient still would like alternative to proctofoam..Marland Kitchen..Marland Kitchen

## 2015-05-01 MED ORDER — HYDROCORTISONE ACETATE 25 MG RE SUPP: 25.0000 mg | Freq: Two times a day (BID) | RECTAL | Status: AC

## 2015-05-01 NOTE — Telephone Encounter (Signed)
Hydrocortisone suppositories 25 mg twice a day 5 days Refill #2

## 2015-05-01 NOTE — Telephone Encounter (Signed)
Left message advising pt that we sent different script to his pharmacy.

## 2015-05-05 ENCOUNTER — Other Ambulatory Visit: Payer: Managed Care, Other (non HMO)

## 2015-05-05 DIAGNOSIS — Z0283 Encounter for blood-alcohol and blood-drug test: Secondary | ICD-10-CM

## 2015-05-07 ENCOUNTER — Ambulatory Visit (INDEPENDENT_AMBULATORY_CARE_PROVIDER_SITE_OTHER): Payer: Managed Care, Other (non HMO)

## 2015-05-07 ENCOUNTER — Ambulatory Visit (INDEPENDENT_AMBULATORY_CARE_PROVIDER_SITE_OTHER): Payer: Managed Care, Other (non HMO) | Admitting: Internal Medicine

## 2015-05-07 VITALS — BP 100/68 | HR 66 | Temp 98.0°F | Resp 18 | Ht 66.0 in | Wt 129.0 lb

## 2015-05-07 DIAGNOSIS — R1011 Right upper quadrant pain: Secondary | ICD-10-CM

## 2015-05-07 DIAGNOSIS — R935 Abnormal findings on diagnostic imaging of other abdominal regions, including retroperitoneum: Secondary | ICD-10-CM

## 2015-05-07 LAB — POCT CBC
GRANULOCYTE PERCENT: 65.5 % (ref 37–80)
HEMATOCRIT: 50.9 % (ref 43.5–53.7)
HEMOGLOBIN: 17.2 g/dL (ref 14.1–18.1)
Lymph, poc: 2.1 (ref 0.6–3.4)
MCH, POC: 32 pg — AB (ref 27–31.2)
MCHC: 33.7 g/dL (ref 31.8–35.4)
MCV: 94.9 fL (ref 80–97)
MID (cbc): 0.2 (ref 0–0.9)
MPV: 7.7 fL (ref 0–99.8)
POC GRANULOCYTE: 4.5 (ref 2–6.9)
POC LYMPH %: 31.1 % (ref 10–50)
POC MID %: 3.4 %M (ref 0–12)
Platelet Count, POC: 210 10*3/uL (ref 142–424)
RBC: 5.36 M/uL (ref 4.69–6.13)
RDW, POC: 12.8 %
WBC: 6.9 10*3/uL (ref 4.6–10.2)

## 2015-05-07 LAB — POCT URINALYSIS DIPSTICK
Bilirubin, UA: NEGATIVE
Glucose, UA: NEGATIVE
Ketones, UA: NEGATIVE
Leukocytes, UA: NEGATIVE
Nitrite, UA: NEGATIVE
Protein, UA: NEGATIVE
RBC UA: NEGATIVE
SPEC GRAV UA: 1.01
UROBILINOGEN UA: 0.2
pH, UA: 7

## 2015-05-07 LAB — POCT UA - MICROSCOPIC ONLY
Bacteria, U Microscopic: NEGATIVE
Casts, Ur, LPF, POC: NEGATIVE
Crystals, Ur, HPF, POC: NEGATIVE
Mucus, UA: NEGATIVE
RBC, URINE, MICROSCOPIC: NEGATIVE
WBC, Ur, HPF, POC: NEGATIVE
Yeast, UA: NEGATIVE

## 2015-05-07 MED ORDER — CILIDINIUM-CHLORDIAZEPOXIDE 2.5-5 MG PO CAPS: 1.0000 | ORAL_CAPSULE | Freq: Three times a day (TID) | ORAL | Status: AC

## 2015-05-07 NOTE — Progress Notes (Addendum)
Subjective:  This chart was scribed for Ellamae Siaobert Ugochukwu Chichester MD,  by Veverly FellsHatice Demirci,scribe, at [facility].  This patient was seen in room 4 and the patient's care was started at 3:15 PM.   Chief Complaint  Patient presents with  . Abdominal Pain    redness and pain on rt side this morning      Patient ID: Manuel Dean, male    DOB: [birth year], 24 y.o.   MRN: [id number]  HPI  HPI Comments: Manuel Dean is a 24 y.o. male who presents to [facility] for right sided abdominal pain onset this morning.  Patient has associated symptoms of redness/swelling to the area along with nausea. He also had episodes of odorous diarrhea yesterday.  While he was sitting on the couch today, he had sharp pains in his abdomen and noticed the redness/bruising to the area.  The pains have now become more dull.  He has not been able to eat anything today and has been having abdominal pains for the last couple of years after fatty meals.  He has been seeing a gastroenterologist for his internal bleeding/hemorid and has had a colonoscopy and endoscopy.  He currently is taking anti suppositories and anti plasmotics.  He denies vomiting, fever/chills, dysuria/hematauria.  He has a job Copyinterview tomorrow but is unable to go from his symptoms.    See his recent workup by Dr.Pyrtle. CT of the abdomen negative 04/15/15  Stress: He says that his gastric symptoms are worse when he feels stress.  His stress is now better than what it was a while ago.     Patient Active Problem List   Diagnosis Date Noted  . Irritable bowel disease 10/10/2014  . Paresthesia and pain of both upper extremities 10/10/2014  . Generalized anxiety disorder 06/26/2012    Class: Acute  . Panic disorder without agoraphobia with moderate panic attacks 06/26/2012  . Cannabis abuse 06/26/2012  . Major depressive disorder, recurrent episode 06/25/2012   Past Medical History  Diagnosis Date  . Scoliosis     . IBS (irritable bowel syndrome)   . Depression   . Anxiety   . Allergy   . Gastropathy    Past Surgical History  Procedure Laterality Date  . Multiple leg surgeries      d/t staph infection   Allergies  Allergen Reactions  . Cashew Nut Oil Anaphylaxis    All tree nuts  . Lactose Intolerance (Gi) Nausea And Vomiting  . Nsaids Other (See Comments)    Internal bleeding  . Sulfa Antibiotics   . Tramadol Nausea And Vomiting    Nausea ,vomiting ,itching   Prior to Admission medications   Medication Sig Start Date End Date Taking? Authorizing Provider  acetaminophen (TYLENOL) 325 MG tablet Take 650 mg by mouth every 6 (six) hours as needed for mild pain or headache.   Yes Historical Provider, MD  glycopyrrolate (ROBINUL) 2 MG tablet Take 1 tablet (2 mg total) by mouth 3 (three) times daily. 04/06/15  Yes Beverley FiedlerJay M Pyrtle, MD  hydrocortisone (ANUSOL-HC) 25 MG suppository Place 1 suppository (25 mg total) rectally 2 (two) times daily. X 5 days.  Pharmacy-please d/c rx for proctofoam. Not covered by insurance. 05/01/15  Yes Beverley FiedlerJay M Pyrtle, MD  ondansetron (ZOFRAN ODT) 4 MG disintegrating tablet Take 1-2 tablets by mouth every 8 hours as needed 04/06/15  Yes Beverley FiedlerJay M Pyrtle, MD  dicyclomine (BENTYL) 10 MG capsule Take 10 mg by mouth 4 (four)  times daily -  before meals and at bedtime.    Historical Provider, MD   History   Social History  . Marital Status: Married    Spouse Name: N/A  . Number of Children: N/A  . Years of Education: N/A   Occupational History  . Not on file.   Social History Main Topics  . Smoking status: Current Every Day Smoker -- 0.25 packs/day for 1 years    Types: Cigarettes  . Smokeless tobacco: Never Used  . Alcohol Use: No     Comment: occassionaly  . Drug Use: Yes    Special: Marijuana     Comment: Last use 3 weeks ago  . Sexual Activity: Not on file   Other Topics Concern  . Not on file   Social History Narrative     Review of Systems  Constitutional:  Negative for fever, chills and diaphoresis.  Respiratory: Negative for cough, choking and shortness of breath.   Gastrointestinal: Positive for nausea, abdominal pain and diarrhea. Negative for vomiting and blood in stool.  Genitourinary: Negative for dysuria, urgency, hematuria and decreased urine volume.  Skin: Positive for rash.       Objective:   Physical Exam  Constitutional: He appears well-developed and well-nourished.  He has very concerned facies.   HENT:  Head: Normocephalic and atraumatic.  Eyes: EOM are normal. Pupils are equal, round, and reactive to light.  Neck: Normal range of motion. Neck supple.  No nuchal rigidity  Cardiovascular: Normal rate and intact distal pulses.   Pulmonary/Chest: Effort normal.  Abdominal: Soft. He exhibits no distension and no mass. There is tenderness. There is rebound and guarding.  His bowel sounds are active and he guards during exam with tenderness only in the right upper quadrant to palpation and percussion. Mild tenderness to percussion right flank.   Musculoskeletal: Normal range of motion. He exhibits no edema.  Neurological:     Skin: Skin is warm. He is not diaphoretic.  There are no vescicular lesions or rashes.  He has some scattered follicular irritation over his back  Psychiatric: He has a normal mood and affect. His behavior is normal.  Nursing note and vitals reviewed.  Filed Vitals:   05/07/15 1502  BP: 100/68  Pulse: 66  Temp: 98 F (36.7 C)  TempSrc: Oral  Resp: 18  Height: 5\' 6"  (1.676 m)  Weight: 129 lb (58.514 kg)  SpO2: 98%   [facility] reading (PRIMARY) by  Dr.Katherinne Mofield= radiopaque density in the right lower quadrant? Etiology. Otherwise abdomen is negative//he was appropriately draped without ext clothing that could cause artifact--No hematuria  Results for orders placed or performed in visit on 05/07/15  POCT CBC  Result Value Ref Range   WBC 6.9 4.6 - 10.2 K/uL   Lymph, poc 2.1 0.6 - 3.4   POC LYMPH  PERCENT 31.1 10 - 50 %L   MID (cbc) 0.2 0 - 0.9   POC MID % 3.4 0 - 12 %M   POC Granulocyte 4.5 2 - 6.9   Granulocyte percent 65.5 37 - 80 %G   RBC 5.36 4.69 - 6.13 M/uL   Hemoglobin 17.2 14.1 - 18.1 g/dL   HCT, POC 16.150.9 09.643.5 - 53.7 %   MCV 94.9 80 - 97 fL   MCH, POC 32.0 (A) 27 - 31.2 pg   MCHC 33.7 31.8 - 35.4 g/dL   RDW, POC 04.512.8 %   Platelet Count, POC 210 142 - 424 K/uL   MPV 7.7 0 -  99.8 fL  POCT urinalysis dipstick  Result Value Ref Range   Color, UA yellow    Clarity, UA clear    Glucose, UA negative    Bilirubin, UA negative    Ketones, UA negative    Spec Grav, UA 1.010    Blood, UA negative    pH, UA 7.0    Protein, UA negative    Urobilinogen, UA 0.2    Nitrite, UA negative    Leukocytes, UA Negative   POCT UA - Microscopic Only  Result Value Ref Range   WBC, Ur, HPF, POC negative    RBC, urine, microscopic negative    Bacteria, U Microscopic negative    Mucus, UA negative    Epithelial cells, urine per micros 0-1    Crystals, Ur, HPF, POC negative    Casts, Ur, LPF, POC negative    Yeast, UA negative       Assessment & Plan:  I have completed the patient encounter in its entirety as documented by the scribe, with editing by me where necessary. Dalesha Stanback P. Merla Riches, M.D.  RUQ abdominal pain - Plan: POCT CBC, POCT urinalysis dipstick, POCT UA - Microscopic Only, Comprehensive metabolic panel, DG Abd 1 View  Meds ordered this encounter  Medications  . clidinium-chlordiazePOXIDE (LIBRAX) 5-2.5 MG per capsule    Sig: Take 1 capsule by mouth 4 (four) times daily -  before meals and at bedtime.    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

## 2015-05-08 LAB — COMPREHENSIVE METABOLIC PANEL
ALBUMIN: 4.9 g/dL (ref 3.5–5.2)
ALT: 11 U/L (ref 0–53)
AST: 18 U/L (ref 0–37)
Alkaline Phosphatase: 65 U/L (ref 39–117)
BILIRUBIN TOTAL: 0.6 mg/dL (ref 0.2–1.2)
BUN: 8 mg/dL (ref 6–23)
CO2: 30 meq/L (ref 19–32)
Calcium: 10.2 mg/dL (ref 8.4–10.5)
Chloride: 101 mEq/L (ref 96–112)
Creat: 1.21 mg/dL (ref 0.50–1.35)
Glucose, Bld: 82 mg/dL (ref 70–99)
POTASSIUM: 4.2 meq/L (ref 3.5–5.3)
SODIUM: 137 meq/L (ref 135–145)
Total Protein: 7.5 g/dL (ref 6.0–8.3)

## 2015-05-11 ENCOUNTER — Telehealth: Payer: Self-pay | Admitting: *Deleted

## 2015-05-11 ENCOUNTER — Other Ambulatory Visit: Payer: Self-pay

## 2015-05-11 DIAGNOSIS — K589 Irritable bowel syndrome without diarrhea: Secondary | ICD-10-CM

## 2015-05-11 DIAGNOSIS — Z0283 Encounter for blood-alcohol and blood-drug test: Secondary | ICD-10-CM

## 2015-05-11 LAB — PRESCRIPTION MONITORING PROFILE (8 PANEL)
Amphetamine/Meth: NEGATIVE ng/mL
BARBITURATE SCREEN, URINE: NEGATIVE ng/mL
BENZODIAZEPINE SCREEN, URINE: NEGATIVE ng/mL
COCAINE METABOLITES: NEGATIVE ng/mL
Cannabinoid Scrn, Ur: NEGATIVE ng/mL
Creatinine, Urine: 116.63 mg/dL (ref >20.0)
Methadone Screen, Urine: NEGATIVE ng/mL
Nitrites, Initial: NEGATIVE ug/mL
Opiate Screen, Urine: NEGATIVE ng/mL
Oxycodone Screen, Ur: NEGATIVE ng/mL
pH, Initial: 7.6 pH (ref 4.5–8.9)

## 2015-05-11 MED ORDER — DRONABINOL 2.5 MG PO CAPS: 2.5000 mg | ORAL_CAPSULE | Freq: Two times a day (BID) | ORAL | Status: AC

## 2015-05-11 NOTE — Telephone Encounter (Signed)
I have spoken to patient to advise of Dr Lauro FranklinPyrtle's recommendations. He verbalizes understanding and will come pick up marinol rx at his convenience. Labs placed in epic for 1 month from now.

## 2015-05-11 NOTE — Telephone Encounter (Signed)
-----   Message from Beverley FiedlerJay M Pyrtle, MD sent at 05/11/2015  8:54 AM EDT ----- Neg drug screening Marinol 2.5 mg BID can be prescribed,  Repeat drug screen in 1 month (let him know about repeat a few days before)

## 2015-05-12 ENCOUNTER — Ambulatory Visit
Admission: RE | Admit: 2015-05-12 | Discharge: 2015-05-12 | Disposition: A | Discharge status: Home / Self Care | Payer: Managed Care, Other (non HMO) | Source: Ambulatory Visit | Attending: Internal Medicine | Admitting: Internal Medicine

## 2015-05-12 DIAGNOSIS — R935 Abnormal findings on diagnostic imaging of other abdominal regions, including retroperitoneum: Secondary | ICD-10-CM

## 2015-05-12 DIAGNOSIS — R1011 Right upper quadrant pain: Secondary | ICD-10-CM

## 2015-05-18 ENCOUNTER — Other Ambulatory Visit: Payer: Self-pay

## 2015-05-18 ENCOUNTER — Telehealth: Payer: Self-pay | Admitting: Internal Medicine

## 2015-05-18 DIAGNOSIS — K8020 Calculus of gallbladder without cholecystitis without obstruction: Secondary | ICD-10-CM

## 2015-05-18 NOTE — Telephone Encounter (Signed)
See result note.  

## 2015-05-23 ENCOUNTER — Encounter: Payer: Self-pay | Admitting: *Deleted

## 2015-06-01 ENCOUNTER — Other Ambulatory Visit: Payer: Self-pay | Admitting: Internal Medicine

## 2015-06-01 ENCOUNTER — Ambulatory Visit (HOSPITAL_COMMUNITY): Admission: RE | Admit: 2015-06-01 | Payer: Managed Care, Other (non HMO) | Source: Ambulatory Visit

## 2015-06-01 DIAGNOSIS — K8020 Calculus of gallbladder without cholecystitis without obstruction: Secondary | ICD-10-CM

## 2015-06-12 ENCOUNTER — Telehealth: Payer: Self-pay

## 2015-06-12 NOTE — Telephone Encounter (Signed)
-----   Message from Jessee Avers, CMA sent at 06/07/2015  8:54 AM EDT ----- Called patient and reminded him to come in for labs (did not tell him about drug screen) for 06/12/15. Pt states he will have them done.  ----- Message -----    From: Richardson Chiquito, CMA    Sent: 06/07/2015      To: Richardson Chiquito, CMA  Call pt to remind to come for labwork (do NOT advise it is for drug screen) on 06/12/15 (see 05/11/15 telephone note). He is already aware we will be calling to remind him to have bloodwork

## 2015-06-12 NOTE — Telephone Encounter (Signed)
Letter mailed to the pt to have labs 

## 2015-06-21 ENCOUNTER — Encounter: Payer: Self-pay | Admitting: Internal Medicine

## 2015-06-21 ENCOUNTER — Ambulatory Visit: Payer: Managed Care, Other (non HMO) | Admitting: Internal Medicine

## 2015-06-21 NOTE — Progress Notes (Signed)
Patient ID: Manuel Dean, male   DOB: 04-05-1991, 24 y.o.   MRN: [id number] The patient's chart has been reviewed by Dr. Rhea Belton  and the recommendations are noted below.   Follow-up advised. Schedule patient for next available appointment. Outcome of communication with the patient:   letter mailed

## 2015-07-04 ENCOUNTER — Telehealth: Payer: Self-pay

## 2015-07-04 NOTE — Telephone Encounter (Signed)
-----   Message from Chrystie NoseLinda R Hunt, RN sent at 05/11/2015  2:50 PM EDT ----- Regarding: Drug screen Pt needs drug screen-order in epic

## 2015-07-04 NOTE — Telephone Encounter (Signed)
Pt mailed letter to come for labs. Have been unable to reach pt by phone.

## 2015-07-18 ENCOUNTER — Ambulatory Visit (INDEPENDENT_AMBULATORY_CARE_PROVIDER_SITE_OTHER): Payer: Managed Care, Other (non HMO) | Admitting: Family Medicine

## 2015-07-18 ENCOUNTER — Ambulatory Visit (INDEPENDENT_AMBULATORY_CARE_PROVIDER_SITE_OTHER): Payer: Managed Care, Other (non HMO)

## 2015-07-18 VITALS — BP 98/60 | HR 69 | Temp 99.0°F | Resp 14 | Ht 66.25 in | Wt 129.8 lb

## 2015-07-18 DIAGNOSIS — R1084 Generalized abdominal pain: Secondary | ICD-10-CM | POA: Diagnosis not present

## 2015-07-18 DIAGNOSIS — K5901 Slow transit constipation: Secondary | ICD-10-CM | POA: Diagnosis not present

## 2015-07-18 LAB — POCT CBC
Granulocyte percent: 61.2 %G (ref 37–80)
HCT, POC: 46.8 % (ref 43.5–53.7)
Hemoglobin: 16.4 g/dL (ref 14.1–18.1)
Lymph, poc: 2.4 (ref 0.6–3.4)
MCH, POC: 32 pg — AB (ref 27–31.2)
MCHC: 35.1 g/dL (ref 31.8–35.4)
MCV: 91.2 fL (ref 80–97)
MID (CBC): 0.3 (ref 0–0.9)
MPV: 7.1 fL (ref 0–99.8)
POC Granulocyte: 4.3 (ref 2–6.9)
POC LYMPH %: 34.9 % (ref 10–50)
POC MID %: 3.9 % (ref 0–12)
Platelet Count, POC: 188 10*3/uL (ref 142–424)
RBC: 5.13 M/uL (ref 4.69–6.13)
RDW, POC: 12.2 %
WBC: 7 10*3/uL (ref 4.6–10.2)

## 2015-07-18 NOTE — Progress Notes (Signed)
  Subjective:  Patient ID: Manuel Dean, male    DOB: Jul 02, 1991  Age: 24 y.o. MRN: [id number]  24 year old man with a history of having constipation and abdominal pain over the past 5 days. He has a history of irritable bowel syndrome, with intermittent diarrhea and constipation in the past. He has been evaluated by Dr. Sharla KidneyPirtle, gastroenterologist, with an upper endoscopy and a colonoscopy. He is also had a ultrasound of his abdomen during the last year. He does have some sludge in his gallbladder. The upper and lower GI studies were very nonspecific. He has tried to eat right foods to keep his bowels moving. However nothing seems to work the last few days. He works at [employer]. He is non-a lot of regular medications. He had a history of weight loss, but in looking back over the past 3 years appears that he has very back and forth within about a 5 pound window. He smokes about a pack cigarettes a week, smokes marijuana every several days denies using other drugs. He is married, but he does most of the cooking.   Objective:   Pleasant young man in no acute distress though he doesn't feel good. His chest is clear. Heart regular without murmur. Abdomen has normal bowel sounds. Soft without masses but does have some fullness across lower abdomen. He has some generalized tenderness, more on the right side. No rebound. He is nonicteric.  Assessment & Plan:   Assessment: Abdominal pain Constipation History of irritable bowel History of gallbladder sludge  Plan: X-ray abdomen, CBC Results for orders placed or performed in visit on 07/18/15  POCT CBC  Result Value Ref Range   WBC 7.0 4.6 - 10.2 K/uL   Lymph, poc 2.4 0.6 - 3.4   POC LYMPH PERCENT 34.9 10 - 50 %L   MID (cbc) 0.3 0 - 0.9   POC MID % 3.9 0 - 12 %M   POC Granulocyte 4.3 2 - 6.9   Granulocyte percent 61.2 37 - 80 %G   RBC 5.13 4.69 - 6.13 M/uL   Hemoglobin 16.4 14.1 - 18.1 g/dL   HCT, POC 40.946.8 81.143.5 - 53.7 %   MCV 91.2 80 -  97 fL   MCH, POC 32.0 (A) 27 - 31.2 pg   MCHC 35.1 31.8 - 35.4 g/dL   RDW, POC 91.412.2 %   Platelet Count, POC 188 142 - 424 K/uL   MPV 7.1 0 - 99.8 fL   [facility] reading (PRIMARY) by  Dr. Alwyn RenHopper Lots of stool burden in the bowels. Only minimal gas. One phlebolith in right pelvis noted..   There are no Patient Instructions on file for this visit.   Imri Lor, MD 07/18/2015

## 2015-07-18 NOTE — Patient Instructions (Signed)
Drink lots of fluids and get regular exercise  Eat plenty of fiber  Take MiraLAX once or twice daily until stools are moving, then decrease. Can take a half dose a day or a dose every 2 or 3 days if needed, but don't let yourself get to plug up.  If the MiraLAX does not seem to be getting the stools moving, use either a glycerin suppository or a fleets enema to see if you can get it moving from below.  If still no progress take a bottle of magnesium citrate, also available over-the-counter  Minimize the dicyclomine (Bentyl) because it can slow bowels down  In the event of acute abdominal pain go to the emergency room or come back in here at anytime as needed  If problems persist see Dr. Rhea BeltonPyrtle back.

## 2015-08-12 ENCOUNTER — Ambulatory Visit (INDEPENDENT_AMBULATORY_CARE_PROVIDER_SITE_OTHER): Payer: Managed Care, Other (non HMO) | Admitting: Family Medicine

## 2015-08-12 VITALS — BP 112/76 | HR 103 | Temp 98.3°F | Resp 18 | Ht 66.75 in | Wt 130.4 lb

## 2015-08-12 DIAGNOSIS — R51 Headache: Secondary | ICD-10-CM

## 2015-08-12 DIAGNOSIS — R112 Nausea with vomiting, unspecified: Secondary | ICD-10-CM | POA: Diagnosis not present

## 2015-08-12 DIAGNOSIS — G8929 Other chronic pain: Secondary | ICD-10-CM

## 2015-08-12 MED ORDER — BUTALBITAL-APAP-CAFFEINE 50-325-40 MG PO TABS: 1.0000 | ORAL_TABLET | Freq: Four times a day (QID) | ORAL | Status: AC | PRN

## 2015-08-12 MED ORDER — PROMETHAZINE HCL 25 MG PO TABS: 25.0000 mg | ORAL_TABLET | Freq: Three times a day (TID) | ORAL | Status: AC | PRN

## 2015-08-12 MED ORDER — KETOROLAC TROMETHAMINE 60 MG/2ML IM SOLN
60.0000 mg | Freq: Once | INTRAMUSCULAR | Status: AC
  Administered 2015-08-12: 60 mg via INTRAMUSCULAR

## 2015-08-12 NOTE — Progress Notes (Signed)
  Subjective:  Patient ID: Manuel Dean, male    DOB: 02-Jan-1991  Age: 24 y.o. MRN: [id number]  Patient is here for a headache this been going on for several days. He missed work yesterday. He is supposed work Advertising account executive. He has nausea. He's been taking Zofran but not helping a lot. He vomited 3 times yesterday and during the night last night. Is not having a lot of pain. He takes medications as listed. He smokes marijuana. Denies use of other drugs. He works at [employer].   Objective:   No major complaints. TMs normal. Throat clear. Neck supple without nodes. Chest clear. Heart regular without murmurs. Has only urinated once today. Results for orders placed or performed in visit on 07/18/15  POCT CBC  Result Value Ref Range   WBC 7.0 4.6 - 10.2 K/uL   Lymph, poc 2.4 0.6 - 3.4   POC LYMPH PERCENT 34.9 10 - 50 %L   MID (cbc) 0.3 0 - 0.9   POC MID % 3.9 0 - 12 %M   POC Granulocyte 4.3 2 - 6.9   Granulocyte percent 61.2 37 - 80 %G   RBC 5.13 4.69 - 6.13 M/uL   Hemoglobin 16.4 14.1 - 18.1 g/dL   HCT, POC 78.2 95.6 - 53.7 %   MCV 91.2 80 - 97 fL   MCH, POC 32.0 (A) 27 - 31.2 pg   MCHC 35.1 31.8 - 35.4 g/dL   RDW, POC 21.3 %   Platelet Count, POC 188 142 - 424 K/uL   MPV 7.1 0 - 99.8 fL    Assessment & Plan:   Assessment:  Probable migraine Nausea and vomiting Dehydration  Plan:  Urged him to drink more fluids to break this cycle Promethazine if needed for nausea  Patient Instructions  Drink lots of fluids to hydrate herself well. Take Zofran if needed for nausea. You can take the promethazine in addition to Zofran if needed for nausea. Do not have to get that filled if you do not wish to.  Take the Fioricet generic one (or occasionally 2) every 6 hours as needed for headache pain  Avoid marijuana  Return if worse or if acutely severe headache go to the emergency room     Caysie Minnifield, MD 08/12/2015

## 2015-08-12 NOTE — Patient Instructions (Signed)
Drink lots of fluids to hydrate herself well. Take Zofran if needed for nausea. You can take the promethazine in addition to Zofran if needed for nausea. Do not have to get that filled if you do not wish to.  Take the Fioricet generic one (or occasionally 2) every 6 hours as needed for headache pain  Avoid marijuana  Return if worse or if acutely severe headache go to the emergency room

## 2017-07-08 IMAGING — CR DG ABDOMEN 1V
1 series · 1 of 1 positions shown · non-contrast
Comparison: Abdominal film May 07, 2015

CLINICAL DATA: One week of constipation

EXAM:
ABDOMEN - 1 VIEW

[AP]
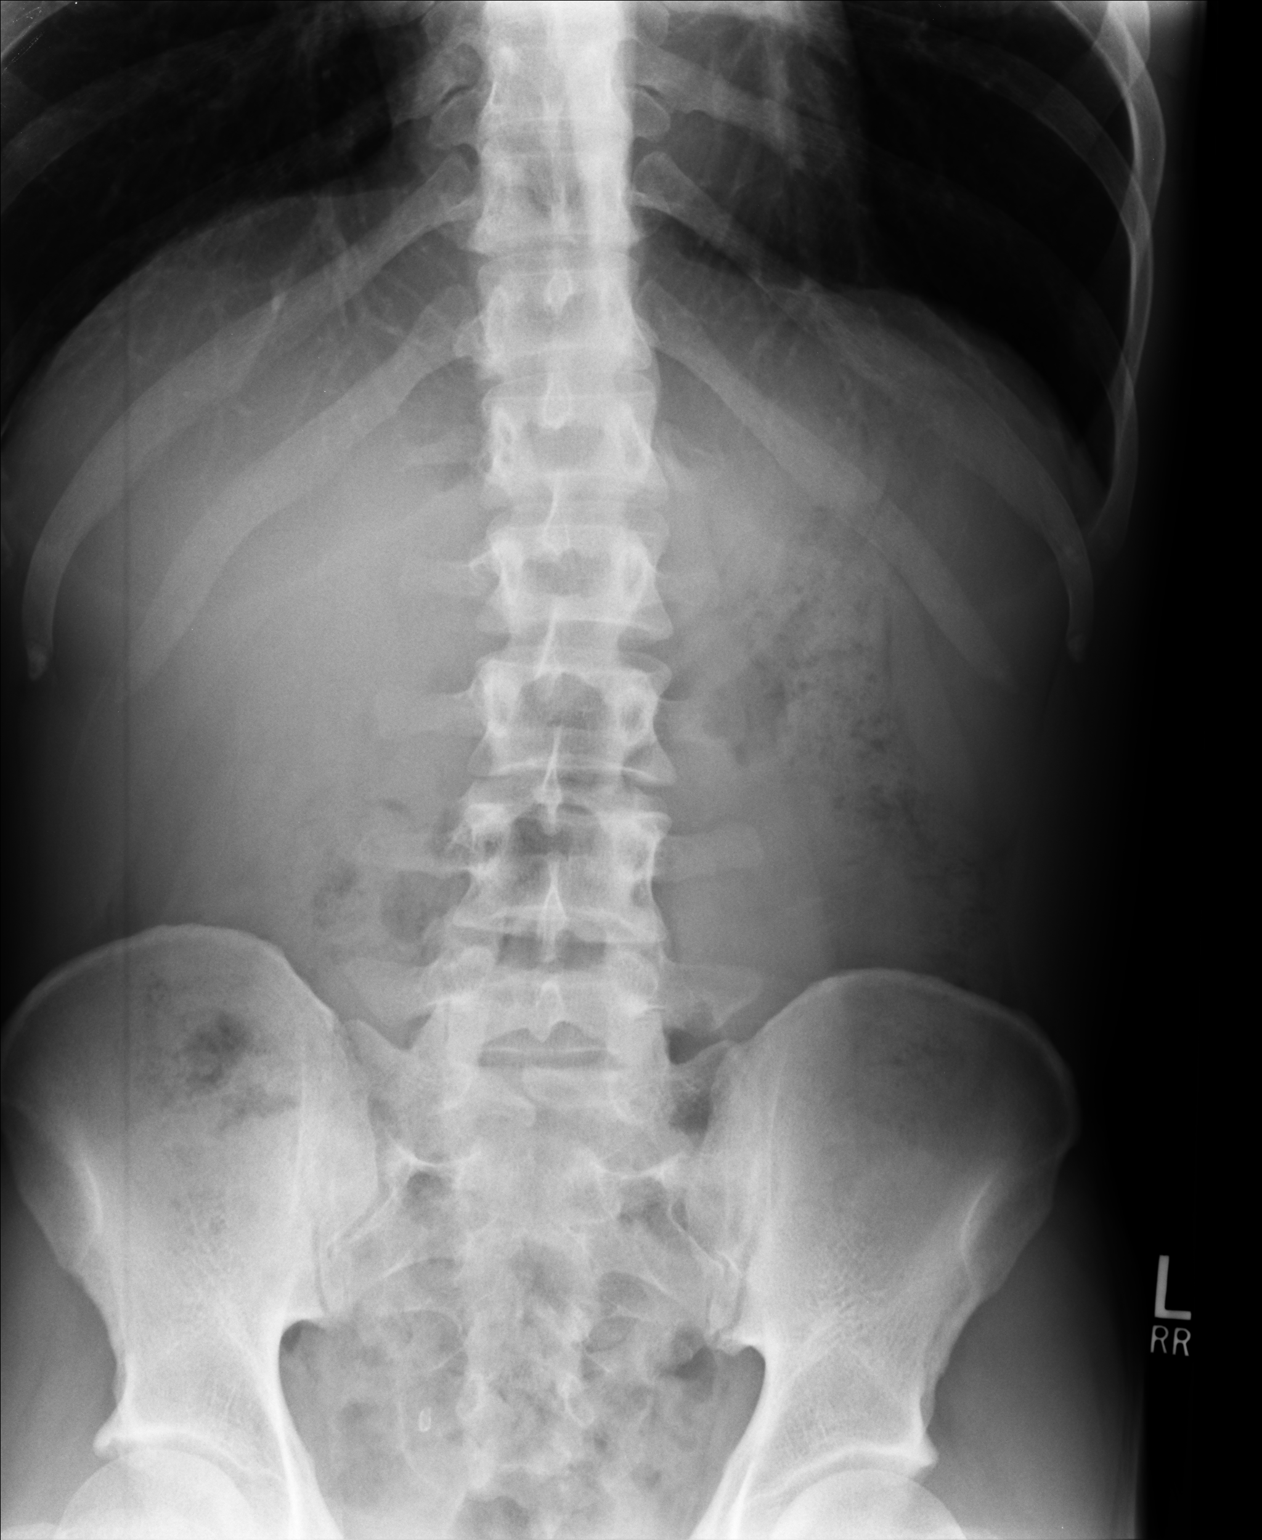

[1 of 1 positions shown; findings below may reference images not displayed]

FINDINGS: There is mildly increased colonic stool burden. There is no small or
large bowel obstruction. There are phleboliths in the right aspect
of the pelvis. The bony structures are unremarkable. The lung bases
are clear.
IMPRESSION: Mildly increased colonic stool burden likely reflects clinical
constipation. No acute intra-abdominal abnormality is observed.
